# Patient Record
Sex: Male | Born: 1935 | Race: White | Hispanic: No | Smoking: Former smoker
Health system: Southern US, Community
[De-identification: ages and names within clinical notes are randomized; demographics above are authoritative.]

## PROBLEM LIST (undated history)

## (undated) DIAGNOSIS — E785 Hyperlipidemia, unspecified: Secondary | ICD-10-CM

## (undated) DIAGNOSIS — I519 Heart disease, unspecified: Secondary | ICD-10-CM

## (undated) DIAGNOSIS — I10 Essential (primary) hypertension: Secondary | ICD-10-CM

## (undated) DIAGNOSIS — E119 Type 2 diabetes mellitus without complications: Secondary | ICD-10-CM

## (undated) HISTORY — PX: CHOLECYSTECTOMY: SHX55

## (undated) HISTORY — DX: Essential (primary) hypertension: I10

## (undated) HISTORY — DX: Hyperlipidemia, unspecified: E78.5

## (undated) HISTORY — DX: Heart disease, unspecified: I51.9

## (undated) HISTORY — DX: Type 2 diabetes mellitus without complications: E11.9

## (undated) HISTORY — PX: RETROPUBIC PROSTATECTOMY: SUR1055

---

## 2004-12-16 ENCOUNTER — Encounter: Admission: RE | Admit: 2004-12-16 | Discharge: 2004-12-16 | Payer: Self-pay | Admitting: Neurological Surgery

## 2012-03-04 LAB — LIPID PANEL
Cholesterol: 112 mg/dL (ref 0–200)
LDL Cholesterol: 48 mg/dL

## 2012-03-29 LAB — CBC AND DIFFERENTIAL
Hemoglobin: 13.4 g/dL — AB (ref 13.5–17.5)
Platelets: 245 10*3/uL (ref 150–399)
WBC: 4.4 10^3/mL

## 2012-04-12 DIAGNOSIS — H903 Sensorineural hearing loss, bilateral: Secondary | ICD-10-CM | POA: Insufficient documentation

## 2012-04-12 DIAGNOSIS — I2510 Atherosclerotic heart disease of native coronary artery without angina pectoris: Secondary | ICD-10-CM | POA: Insufficient documentation

## 2012-06-15 LAB — HEMOGLOBIN A1C: Hgb A1c MFr Bld: 7.5 % — AB (ref 4.0–6.0)

## 2012-11-23 ENCOUNTER — Ambulatory Visit: Payer: Medicare Other | Admitting: Family Medicine

## 2012-11-23 ENCOUNTER — Encounter: Payer: Self-pay | Admitting: Family Medicine

## 2012-11-23 ENCOUNTER — Other Ambulatory Visit: Payer: Self-pay | Admitting: Family Medicine

## 2012-11-30 ENCOUNTER — Ambulatory Visit (INDEPENDENT_AMBULATORY_CARE_PROVIDER_SITE_OTHER): Payer: Medicare Other | Admitting: Family Medicine

## 2012-11-30 ENCOUNTER — Encounter: Payer: Self-pay | Admitting: Family Medicine

## 2012-11-30 VITALS — BP 158/94 | HR 103 | Wt 170.0 lb

## 2012-11-30 DIAGNOSIS — I10 Essential (primary) hypertension: Secondary | ICD-10-CM

## 2012-11-30 DIAGNOSIS — R059 Cough, unspecified: Secondary | ICD-10-CM

## 2012-11-30 DIAGNOSIS — Z79899 Other long term (current) drug therapy: Secondary | ICD-10-CM

## 2012-11-30 DIAGNOSIS — Z5181 Encounter for therapeutic drug level monitoring: Secondary | ICD-10-CM

## 2012-11-30 DIAGNOSIS — L989 Disorder of the skin and subcutaneous tissue, unspecified: Secondary | ICD-10-CM

## 2012-11-30 DIAGNOSIS — H903 Sensorineural hearing loss, bilateral: Secondary | ICD-10-CM

## 2012-11-30 DIAGNOSIS — I2510 Atherosclerotic heart disease of native coronary artery without angina pectoris: Secondary | ICD-10-CM

## 2012-11-30 DIAGNOSIS — E785 Hyperlipidemia, unspecified: Secondary | ICD-10-CM

## 2012-11-30 DIAGNOSIS — E119 Type 2 diabetes mellitus without complications: Secondary | ICD-10-CM

## 2012-11-30 DIAGNOSIS — R05 Cough: Secondary | ICD-10-CM

## 2012-11-30 MED ORDER — MOMETASONE FUROATE 50 MCG/ACT NA SUSP: NASAL | Status: DC

## 2012-11-30 MED ORDER — CLOTRIMAZOLE-BETAMETHASONE 1-0.05 % EX CREA: TOPICAL_CREAM | CUTANEOUS | Status: DC

## 2012-11-30 MED ORDER — RANOLAZINE ER 500 MG PO TB12: 500.0000 mg | ORAL_TABLET | Freq: Two times a day (BID) | ORAL | Status: DC

## 2012-11-30 MED ORDER — PIOGLITAZONE HCL 45 MG PO TABS: 45.0000 mg | ORAL_TABLET | Freq: Every day | ORAL | Status: DC

## 2012-11-30 NOTE — Progress Notes (Signed)
CC: Alex Zavala is a 77 y.o. male is here for Establish Care   Subjective: HPI:  Patient reports a history of coronary artery disease he is currently taking Plavix Ranexa and a beta blocker. He denies any recent exertional chest pain shortness of breath nor irregular heartbeat. Denies limb claudication. He takes a statin on a daily basis tries to stay active with walking.  Patient reports a history of type 2 diabetes currently taking Actos metformin Januvia and invokana. No outside blood sugars to report he believes it's been well over 3 months since his last A1c was checked. Denies hypoglycemic-like episodes. Denies motor or sensory disturbances, vision loss, poorly healing wounds, polyuria polyphasia or polydipsia  Patient reports a history of essential hypertension currently on a beta blocker, enalapril. No outside blood pressures to report. Denies headaches, dizziness, lightheadedness.  Patient has a lesion on his left upper back that has been present for 2 weeks mild sharp pain worse with touch pain is nonradiating. He was using clotrimazole/betamethasone which initially helped the pain however he ran out of this medication one week ago. He has no personal history of skin cancers he has had multiple lesions removed in the past. Pain is present all hours of the day nothing else makes better or worse other than above.  Complains of a cough that has been present For about a month is present on a daily basis mild-moderate severity nonproductive. He believes he was tried on a oral allergy medication that did not help. Symptoms are present all hours of the day. He denies wheezing, shortness of breath, orthopnea nor peripheral edema. Denies fevers, chills, nor chest pain  Review of Systems - General ROS: negative for - chills, fever, night sweats, weight gain or weight loss Ophthalmic ROS: negative for - decreased vision Psychological ROS: negative for - anxiety or depression ENT ROS: negative for  - hearing change, nasal congestion, tinnitus or allergies Hematological and Lymphatic ROS: negative for - bleeding problems, bruising or swollen lymph nodes Breast ROS: negative Respiratory ROS: no shortness of breath, or wheezing Cardiovascular ROS: no chest pain or dyspnea on exertion Gastrointestinal ROS: no abdominal pain, change in bowel habits, or black or bloody stools Genito-Urinary ROS: negative for - genital discharge, genital ulcers, incontinence or abnormal bleeding from genitals Musculoskeletal ROS: negative for - joint pain or muscle pain Neurological ROS: negative for - headaches or memory loss   Past Medical History  Diagnosis Date  . Hypertension   . Heart disease   . Diabetes   . Hyperlipidemia      Family History  Problem Relation Age of Onset  . Hyperlipidemia    . Hypertension       History  Substance Use Topics  . Smoking status: Former Games developer  . Smokeless tobacco: Not on file     Comment: quit smoking 50 years ago  . Alcohol Use: No     Objective: Filed Vitals:   11/30/12 1057  BP: 158/94  Pulse: 103    General: Alert and Oriented, No Acute Distress HEENT: Pupils equal, round, reactive to light. Conjunctivae clear.  External ears unremarkable, canals clear with intact TMs with appropriate landmarks.  Middle ear appears open without effusion. Pink inferior turbinates.  Moist mucous membranes, pharynx without inflammation nor lesions.  Neck supple without palpable lymphadenopathy nor abnormal masses. Lungs: Clear to auscultation bilaterally, no wheezing/ronchi/rales.  Comfortable work of breathing. Good air movement. Cardiac: Regular rate and rhythm. Normal S1/S2.  No murmurs, rubs, nor gallops.  Extremities: No peripheral edema.  Strong peripheral pulses.  Mental Status: No depression, anxiety, nor agitation. Skin: Warm and dry. Overlying the left scapula there is a half a centimeter pedunculated lesion with a bloody scab at the base which appears  moderately inflamed and is tender to the touch, lesion is fleshy colored  Assessment & Plan: Alex Zavala was seen today for establish care.  Diagnoses and associated orders for this visit:  Coronary atherosclerosis - ranolazine (RANEXA) 500 MG 12 hr tablet; Take 1 tablet (500 mg total) by mouth 2 (two) times daily.  Perceptive hearing loss, both sides  Type II diabetes mellitus - Hemoglobin A1c - COMPLETE METABOLIC PANEL WITH GFR - pioglitazone (ACTOS) 45 MG tablet; Take 1 tablet (45 mg total) by mouth daily.  Hyperlipidemia LDL goal < 70 - Lipid panel  Essential hypertension, benign - COMPLETE METABOLIC PANEL WITH GFR  Encounter for monitoring statin therapy - COMPLETE METABOLIC PANEL WITH GFR  Skin lesion of back - clotrimazole-betamethasone (LOTRISONE) cream; Apply to affected area twice a day for two weeks, may require four weeks if involving the feet/toes.  Cough - mometasone (NASONEX) 50 MCG/ACT nasal spray; Two sprays each nostril daily. To help prevent cough.    Coronary artery disease: Controlled continue beta blocker Plavix and Ranexa Type 2 diabetes: Clinically controlled however he is due for A1c and creatinine Hyperlipidemia: Clinically controlled however he is due for fasting LDL along with liver function as he's using a statin Essential hypertension: Uncontrolled chronic condition discussed diet and exercise interventions,  We will likely adjust beta blocker at next visit if remains elevated Skin lesion: Suspect this is an inflamed skin tag or separate keratosis restart betamethasone and if not improved in one week return for biopsy Cough: Suspect postnasal drip start Nasonex Return in about 4 weeks (around 12/28/2012).

## 2012-12-01 ENCOUNTER — Encounter: Payer: Self-pay | Admitting: *Deleted

## 2012-12-01 LAB — COMPLETE METABOLIC PANEL WITH GFR
ALT: 13 U/L (ref 0–53)
AST: 19 U/L (ref 0–37)
CO2: 27 mEq/L (ref 19–32)
Calcium: 10 mg/dL (ref 8.4–10.5)
GFR, Est Non African American: 79 mL/min
Glucose, Bld: 137 mg/dL — ABNORMAL HIGH (ref 70–99)
Total Bilirubin: 0.5 mg/dL (ref 0.3–1.2)

## 2012-12-01 LAB — HEMOGLOBIN A1C: Mean Plasma Glucose: 154 mg/dL — ABNORMAL HIGH (ref <117)

## 2012-12-01 LAB — LIPID PANEL: VLDL: 40 mg/dL (ref 0–40)

## 2012-12-05 ENCOUNTER — Encounter: Payer: Self-pay | Admitting: Family Medicine

## 2012-12-05 DIAGNOSIS — K219 Gastro-esophageal reflux disease without esophagitis: Secondary | ICD-10-CM | POA: Insufficient documentation

## 2012-12-05 DIAGNOSIS — R809 Proteinuria, unspecified: Secondary | ICD-10-CM | POA: Insufficient documentation

## 2012-12-05 DIAGNOSIS — Z8042 Family history of malignant neoplasm of prostate: Secondary | ICD-10-CM | POA: Insufficient documentation

## 2012-12-05 DIAGNOSIS — Z85038 Personal history of other malignant neoplasm of large intestine: Secondary | ICD-10-CM | POA: Insufficient documentation

## 2012-12-06 ENCOUNTER — Encounter: Payer: Self-pay | Admitting: *Deleted

## 2012-12-12 ENCOUNTER — Ambulatory Visit (INDEPENDENT_AMBULATORY_CARE_PROVIDER_SITE_OTHER): Payer: Medicare Other | Admitting: Family Medicine

## 2012-12-12 ENCOUNTER — Ambulatory Visit (INDEPENDENT_AMBULATORY_CARE_PROVIDER_SITE_OTHER): Payer: Medicare Other

## 2012-12-12 ENCOUNTER — Encounter: Payer: Self-pay | Admitting: Family Medicine

## 2012-12-12 VITALS — BP 199/113 | HR 87 | Temp 97.5°F | Wt 172.0 lb

## 2012-12-12 DIAGNOSIS — Z87891 Personal history of nicotine dependence: Secondary | ICD-10-CM

## 2012-12-12 DIAGNOSIS — R042 Hemoptysis: Secondary | ICD-10-CM

## 2012-12-12 DIAGNOSIS — I10 Essential (primary) hypertension: Secondary | ICD-10-CM

## 2012-12-12 NOTE — Progress Notes (Signed)
CC: Alex Zavala is a 77 y.o. male is here for Cough   Subjective: HPI:  Patient reports 2 days coughing up blood. This started yesterday morning bright red blood no clots has not been getting better or worse since onset. He's never had this before. He has had difficulty with a nonproductive cough without blood for the past month no improvement since starting nasal steroids. He takes Plavix daily. Has a history of essential hypertension did not take blood pressure medication this morning. Denies shortness of breath, chest pain, wheezing, orthopnea or bleeding elsewhere. Denies fevers, chills, nor fatigue. Nothing particularly makes bleeding better or worse. Denies sore throat, nasal congestion, postnasal drip.   Review Of Systems Outlined In HPI  Past Medical History  Diagnosis Date  . Hypertension   . Heart disease   . Diabetes   . Hyperlipidemia      Family History  Problem Relation Age of Onset  . Hyperlipidemia    . Hypertension       History  Substance Use Topics  . Smoking status: Former Games developer  . Smokeless tobacco: Not on file     Comment: quit smoking 50 years ago  . Alcohol Use: No     Objective: Filed Vitals:   12/12/12 1321  BP: 199/113  Pulse: 87  Temp: 97.5 F (36.4 C)    General: Alert and Oriented, No Acute Distress HEENT: Pupils equal, round, reactive to light. Conjunctivae clear.  External ears unremarkable, canals clear with intact TMs with appropriate landmarks.  Middle ear appears open without effusion. Pink inferior turbinates.  Moist mucous membranes, pharynx without inflammation nor lesions.  Neck supple without palpable lymphadenopathy nor abnormal masses. Lungs: Clear to auscultation bilaterally, no wheezing/ronchi/rales.  Comfortable work of breathing. Good air movement. Cardiac: Regular rate and rhythm. Normal S1/S2.  No murmurs, rubs, nor gallops.   Extremities: No peripheral edema.  Strong peripheral pulses.  Mental Status: No depression,  anxiety, nor agitation. Skin: Warm and dry.  Assessment & Plan: [place] was seen today for cough.  Diagnoses and associated orders for this visit:  Essential hypertension, benign  Hemoptysis - DG Chest 2 View; Future - Urinalysis, Routine w reflex microscopic - CBC w/Diff - Ambulatory referral to Pulmonology  History of smoking - Ambulatory referral to Pulmonology    Hemoptysis: Low suspicion of infectious etiology, fortunately chest x-ray is without evidence of mass or acute abnormality. Suspicion of possibly a small hemorrhage given his elevated blood pressure today and last visit. Will refer to pulmonology for consideration of bronchoscopy plus or minus CT scan. I encouraged him to stop his Plavix for now, his most recent stent was put in well over a year ago. Signs and symptoms requring emergent/urgent reevaluation were discussed with the patient. Essential hypertension: Uncontrolled, patient expresses understanding to take blood pressure medication immediately when he gets home followup later this week for blood pressure recheck  Return in about 1 week (around 12/19/2012).

## 2012-12-12 NOTE — Patient Instructions (Addendum)
-  Stop Plavix (clopidogrel) until told to restart. -You should be getting a phone call about a pulmonologist appointment sometime this week, call me if you do not get contacted about this by Wednesday.

## 2012-12-13 ENCOUNTER — Encounter: Payer: Self-pay | Admitting: Family Medicine

## 2012-12-13 LAB — URINALYSIS, MICROSCOPIC ONLY: Crystals: NONE SEEN

## 2012-12-13 LAB — CBC WITH DIFFERENTIAL/PLATELET
Basophils Absolute: 0 10*3/uL (ref 0.0–0.1)
Basophils Relative: 0 % (ref 0–1)
HCT: 40.8 % (ref 39.0–52.0)
Lymphs Abs: 1.6 10*3/uL (ref 0.7–4.0)
MCHC: 33.1 g/dL (ref 30.0–36.0)
MCV: 89.9 fL (ref 78.0–100.0)
Monocytes Absolute: 0.7 10*3/uL (ref 0.1–1.0)
Monocytes Relative: 14 % — ABNORMAL HIGH (ref 3–12)
Neutro Abs: 2.2 10*3/uL (ref 1.7–7.7)
Neutrophils Relative %: 48 % (ref 43–77)
Platelets: 267 10*3/uL (ref 150–400)
RBC: 4.54 MIL/uL (ref 4.22–5.81)
RDW: 15.2 % (ref 11.5–15.5)

## 2012-12-13 LAB — URINALYSIS, ROUTINE W REFLEX MICROSCOPIC
Hgb urine dipstick: NEGATIVE
Ketones, ur: NEGATIVE mg/dL
Nitrite: NEGATIVE
Specific Gravity, Urine: 1.024 (ref 1.005–1.030)
Urobilinogen, UA: 0.2 mg/dL (ref 0.0–1.0)

## 2012-12-14 ENCOUNTER — Encounter: Payer: Self-pay | Admitting: *Deleted

## 2012-12-15 ENCOUNTER — Encounter: Payer: Self-pay | Admitting: Family Medicine

## 2012-12-22 ENCOUNTER — Institutional Professional Consult (permissible substitution): Payer: Medicare Other | Admitting: Critical Care Medicine

## 2012-12-23 ENCOUNTER — Encounter: Payer: Self-pay | Admitting: Critical Care Medicine

## 2012-12-30 ENCOUNTER — Encounter: Payer: Self-pay | Admitting: Family Medicine

## 2012-12-30 ENCOUNTER — Ambulatory Visit (INDEPENDENT_AMBULATORY_CARE_PROVIDER_SITE_OTHER): Payer: Medicare Other | Admitting: Family Medicine

## 2012-12-30 VITALS — BP 157/82 | HR 75 | Wt 174.0 lb

## 2012-12-30 DIAGNOSIS — L918 Other hypertrophic disorders of the skin: Secondary | ICD-10-CM

## 2012-12-30 DIAGNOSIS — L909 Atrophic disorder of skin, unspecified: Secondary | ICD-10-CM

## 2012-12-30 DIAGNOSIS — R042 Hemoptysis: Secondary | ICD-10-CM

## 2012-12-30 DIAGNOSIS — Z23 Encounter for immunization: Secondary | ICD-10-CM

## 2012-12-30 NOTE — Patient Instructions (Signed)
Stop taking your plavix (clopidogrel) until after your CT scan of your chest.

## 2012-12-30 NOTE — Progress Notes (Signed)
CC: Alex Zavala is a 77 y.o. male is here for recheck place on the back   Subjective: HPI:  Followup skin lesion: Patient has been using clotrimazole on a lesion on his upper left back using this on a daily basis he reports he is no longer having pain or irritation anywhere in or on the back. Denies any new suspicious lesions or pigmented lesions.  Patient was unable to see pulmonology due [place]. He is still having occasional hemoptysis most days of the week. Symptoms do not seem to be getting better or worse. He is still taking Plavix. He denies shortness of breath, chest pain, fatigue, rapid heartbeat, nasal congestion fevers or chills.   Review Of Systems Outlined In HPI  Past Medical History  Diagnosis Date  . Hypertension   . Heart disease   . Diabetes   . Hyperlipidemia      Family History  Problem Relation Age of Onset  . Hyperlipidemia    . Hypertension       History  Substance Use Topics  . Smoking status: Former Games developer  . Smokeless tobacco: Not on file     Comment: quit smoking 50 years ago  . Alcohol Use: No     Objective: Filed Vitals:   12/30/12 0959  BP: 157/82  Pulse: 75    General: Alert and Oriented, No Acute Distress HEENT: Pupils equal, round, reactive to light. Conjunctivae clear.  Moist mucous membranes pharynx unremarkable Lungs: Clear to auscultation bilaterally, no wheezing/ronchi/rales.  Comfortable work of breathing. Good air movement. Cardiac: Regular rate and rhythm. Normal S1/S2.  No murmurs, rubs, nor gallops.   Extremities: No peripheral edema.  Strong peripheral pulses.  Mental Status: No depression, anxiety, nor agitation. Skin: Warm and dry. The skin lesion was on his back one month ago is no longer present there are no skin abnormalities on the back other than scattered seborrheic keratoses on the lower torso  Assessment & Plan: Alex Zavala was seen today for recheck place on the back.  Diagnoses and associated  orders for this visit:  Hemoptysis - CT Chest W Contrast; Future  Skin tag  Need for prophylactic vaccination and inoculation against influenza   Hemoptysis: Uncontrolled we are ordering CT scan of the chest with contrast I again instructed him to stop his Plavix until we have a source of his bleeding. Skin tag: I suspect that the lesion we saw last month was a inflamed skin tag, suspect the stalk thinned due to topical steroid and fell off by itself. No further intervention   Return in about 4 weeks (around 01/27/2013).

## 2013-01-05 ENCOUNTER — Ambulatory Visit (INDEPENDENT_AMBULATORY_CARE_PROVIDER_SITE_OTHER): Payer: Medicare Other

## 2013-01-05 ENCOUNTER — Encounter: Payer: Self-pay | Admitting: Family Medicine

## 2013-01-05 ENCOUNTER — Encounter: Payer: Self-pay | Admitting: *Deleted

## 2013-01-05 DIAGNOSIS — I517 Cardiomegaly: Secondary | ICD-10-CM

## 2013-01-05 DIAGNOSIS — E042 Nontoxic multinodular goiter: Secondary | ICD-10-CM

## 2013-01-05 DIAGNOSIS — R042 Hemoptysis: Secondary | ICD-10-CM

## 2013-01-05 DIAGNOSIS — I2510 Atherosclerotic heart disease of native coronary artery without angina pectoris: Secondary | ICD-10-CM

## 2013-01-05 DIAGNOSIS — R911 Solitary pulmonary nodule: Secondary | ICD-10-CM

## 2013-01-05 MED ORDER — IOHEXOL 300 MG/ML  SOLN
80.0000 mL | Freq: Once | INTRAMUSCULAR | Status: AC | PRN
  Administered 2013-01-05: 80 mL via INTRAVENOUS

## 2013-01-06 ENCOUNTER — Ambulatory Visit: Payer: Medicare Other | Admitting: Family Medicine

## 2013-01-30 ENCOUNTER — Ambulatory Visit: Payer: Medicare Other | Admitting: Family Medicine

## 2013-01-30 DIAGNOSIS — Z0289 Encounter for other administrative examinations: Secondary | ICD-10-CM

## 2013-03-29 ENCOUNTER — Telehealth: Payer: Self-pay | Admitting: *Deleted

## 2013-03-29 ENCOUNTER — Other Ambulatory Visit: Payer: Self-pay | Admitting: *Deleted

## 2013-03-29 DIAGNOSIS — L989 Disorder of the skin and subcutaneous tissue, unspecified: Secondary | ICD-10-CM

## 2013-03-29 MED ORDER — SITAGLIPTIN PHOSPHATE 100 MG PO TABS: 100.0000 mg | ORAL_TABLET | Freq: Every day | ORAL | Status: DC

## 2013-03-29 MED ORDER — CELECOXIB 200 MG PO CAPS: 200.0000 mg | ORAL_CAPSULE | Freq: Every day | ORAL | Status: DC | PRN

## 2013-03-29 MED ORDER — LOVASTATIN 40 MG PO TABS: ORAL_TABLET | ORAL | Status: DC

## 2013-03-29 MED ORDER — FENOFIBRATE 48 MG PO TABS: 48.0000 mg | ORAL_TABLET | Freq: Every day | ORAL | Status: DC

## 2013-03-29 MED ORDER — CLOTRIMAZOLE-BETAMETHASONE 1-0.05 % EX CREA: TOPICAL_CREAM | CUTANEOUS | Status: AC

## 2013-03-29 MED ORDER — CLOPIDOGREL BISULFATE 75 MG PO TABS: 75.0000 mg | ORAL_TABLET | Freq: Once | ORAL | Status: DC

## 2013-03-29 NOTE — Telephone Encounter (Signed)
Pt came in because he needed refills of his medications to be sent to [facility] in [place]. Also his new address is [address] [id number].[name] [place]

## 2013-03-29 NOTE — Telephone Encounter (Signed)
Pt would like to inform dr.hommel that due to changes in his insurance he can no longer come here .[name] [place]

## 2013-04-03 ENCOUNTER — Other Ambulatory Visit: Payer: Self-pay | Admitting: *Deleted

## 2013-04-03 MED ORDER — CELECOXIB 200 MG PO CAPS: 200.0000 mg | ORAL_CAPSULE | Freq: Every day | ORAL | Status: DC | PRN

## 2013-04-03 MED ORDER — FENOFIBRATE 48 MG PO TABS: 48.0000 mg | ORAL_TABLET | Freq: Every day | ORAL | Status: DC

## 2013-04-03 MED ORDER — METOPROLOL TARTRATE 100 MG PO TABS: 100.0000 mg | ORAL_TABLET | Freq: Every day | ORAL | Status: DC

## 2013-05-12 ENCOUNTER — Other Ambulatory Visit: Payer: Self-pay | Admitting: Family Medicine

## 2013-05-19 ENCOUNTER — Other Ambulatory Visit: Payer: Self-pay | Admitting: Family Medicine

## 2013-06-30 ENCOUNTER — Other Ambulatory Visit: Payer: Self-pay | Admitting: Family Medicine

## 2013-07-05 ENCOUNTER — Encounter: Payer: Self-pay | Admitting: Family Medicine

## 2013-07-05 ENCOUNTER — Telehealth: Payer: Self-pay | Admitting: Family Medicine

## 2013-07-05 ENCOUNTER — Ambulatory Visit (INDEPENDENT_AMBULATORY_CARE_PROVIDER_SITE_OTHER): Payer: Medicare Other | Admitting: Family Medicine

## 2013-07-05 VITALS — BP 161/86 | HR 93 | Ht 64.0 in | Wt 167.0 lb

## 2013-07-05 DIAGNOSIS — E119 Type 2 diabetes mellitus without complications: Secondary | ICD-10-CM

## 2013-07-05 DIAGNOSIS — Z13220 Encounter for screening for lipoid disorders: Secondary | ICD-10-CM

## 2013-07-05 DIAGNOSIS — Z125 Encounter for screening for malignant neoplasm of prostate: Secondary | ICD-10-CM

## 2013-07-05 DIAGNOSIS — Z131 Encounter for screening for diabetes mellitus: Secondary | ICD-10-CM

## 2013-07-05 DIAGNOSIS — Z136 Encounter for screening for cardiovascular disorders: Secondary | ICD-10-CM

## 2013-07-05 DIAGNOSIS — Z0000 Encounter for general adult medical examination without abnormal findings: Secondary | ICD-10-CM

## 2013-07-05 MED ORDER — ENALAPRIL MALEATE 10 MG PO TABS: 10.0000 mg | ORAL_TABLET | Freq: Every day | ORAL | Status: DC

## 2013-07-05 MED ORDER — CELECOXIB 200 MG PO CAPS: 200.0000 mg | ORAL_CAPSULE | Freq: Every day | ORAL | Status: DC | PRN

## 2013-07-05 MED ORDER — CANAGLIFLOZIN 100 MG PO TABS
1.0000 | ORAL_TABLET | Freq: Every day | ORAL | Status: DC
Start: 2013-07-05 — End: 2013-10-03

## 2013-07-05 MED ORDER — METOPROLOL TARTRATE 100 MG PO TABS: ORAL_TABLET | ORAL | Status: DC

## 2013-07-05 MED ORDER — CLOTRIMAZOLE-BETAMETHASONE 1-0.05 % EX CREA: TOPICAL_CREAM | CUTANEOUS | Status: DC

## 2013-07-05 NOTE — Telephone Encounter (Signed)
Seth Bake, Can you please see if the [facility] has any records of colonoscopies within the last 5 year for this patient. He believes he had this done in [place] most recently possibly 2 years ago

## 2013-07-05 NOTE — Patient Instructions (Signed)
Dr. Atul Delucia's General Advice Following Your Complete Physical Exam  The Benefits of Regular Exercise: Unless you suffer from an uncontrolled cardiovascular condition, studies strongly suggest that regular exercise and physical activity will add to both the quality and length of your life.  The World Health Organization recommends 150 minutes of moderate intensity aerobic activity every week.  This is best split over 3-4 days a week, and can be as simple as a brisk walk for just over 35 minutes "most days of the week".  This type of exercise has been shown to lower LDL-Cholesterol, lower average blood sugars, lower blood pressure, lower cardiovascular disease risk, improve memory, and increase one's overall sense of wellbeing.  The addition of anaerobic (or "strength training") exercises offers additional benefits including but not limited to increased metabolism, prevention of osteoporosis, and improved overall cholesterol levels.  How Can I Strive For A Low-Fat Diet?: Current guidelines recommend that 25-35 percent of your daily energy (food) intake should come from fats.  One might ask how can this be achieved without having to dissect each meal on a daily basis?  Switch to skim or 1% milk instead of whole milk.  Focus on lean meats such as ground turkey, fresh fish, baked chicken, and lean cuts of beef as your source of dietary protein.  Limit saturated fat consumption to less than 10% of your daily caloric intake.  Limit trans fatty acid consumption primarily by limiting synthetic trans fats such as partially hydrogenated oils (Ex: fried fast foods).  Substitute olive or vegetable oil for solid fats where possible.  Moderation of Salt Intake: Provided you don't carry a diagnosis of congestive heart failure nor renal failure, I recommend a daily allowance of no more than 2300 mg of salt (sodium).  Keeping under this daily goal is associated with a decreased risk of cardiovascular events, creeping  above it can lead to elevated blood pressures and increases your risk of cardiovascular events.  Milligrams (mg) of salt is listed on all nutrition labels, and your daily intake can add up faster than you think.  Most canned and frozen dinners can pack in over half your daily salt allowance in one meal.    Lifestyle Health Risks: Certain lifestyle choices carry specific health risks.  As you may already know, tobacco use has been associated with increasing one's risk of cardiovascular disease, pulmonary disease, numerous cancers, among many other issues.  What you may not know is that there are medications and nicotine replacement strategies that can more than double your chances of successfully quitting.  I would be thrilled to help manage your quitting strategy if you currently use tobacco products.  When it comes to alcohol use, I've yet to find an "ideal" daily allowance.  Provided an individual does not have a medical condition that is exacerbated by alcohol consumption, general guidelines determine "safe drinking" as no more than two standard drinks for a man or no more than one standard drink for a male per day.  However, much debate still exists on whether any amount of alcohol consumption is technically "safe".  My general advice, keep alcohol consumption to a minimum for general health promotion.  If you or others believe that alcohol, tobacco, or recreational drug use is interfering with your life, I would be happy to provide confidential counseling regarding treatment options.  General "Over The Counter" Nutrition Advice: Postmenopausal women should aim for a daily calcium intake of 1200 mg, however a significant portion of this might already be   provided by diets including milk, yogurt, cheese, and other dairy products.  Vitamin D has been shown to help preserve bone density, prevent fatigue, and has even been shown to help reduce falls in the elderly.  Ensuring a daily intake of 800 Units of  Vitamin D is a good place to start to enjoy the above benefits, we can easily check your Vitamin D level to see if you'd potentially benefit from supplementation beyond 800 Units a day.  Folic Acid intake should be of particular concern to women of childbearing age.  Daily consumption of 400-800 mcg of Folic Acid is recommended to minimize the chance of spinal cord defects in a fetus should pregnancy occur.    For many adults, accidents still remain one of the most common culprits when it comes to cause of death.  Some of the simplest but most effective preventitive habits you can adopt include regular seatbelt use, proper helmet use, securing firearms, and regularly testing your smoke and carbon monoxide detectors.  Krisha Beegle B. Hager [place] DO [facility] [address], [address] [place], [place] [address] Phone: [phone]  

## 2013-07-05 NOTE — Progress Notes (Signed)
Subjective:    Alex Zavala is a 78 y.o. male who presents for Medicare Annual/Subsequent preventive examination.   Preventive Screening-Counseling & Management  Colonoscopy: Most recent colonoscopy 09/2009 based on our records, he predicts this was in 2014 with [facility]. Will ask my staff to look into obtaining these records  Prostate: Discussed screening risks/beneifts with patient today, history of prostatectomy. He tells me that his understanding was that the prostate cancer was confined to the prostate and had not metastasized. He is interested in PSA  Influenza Vaccine: Out of season Pneumovax: 2006 and 2011 Td/Tdap: 2008 Zoster: 2014  He's requesting refills on multiple medications for his pre-existing conditions. We will provide him with refills until he can followup for a visit dedicated to these conditions.  Since I saw him last he tells that he had a fall on his buttocks when slipping in his house. This is been the only fall in the last year.   Tobacco History  Smoking status  . Former Smoker  Smokeless tobacco  . Not on file    Comment: quit smoking 50 years ago    Problems Prior to Visit 1. essential hypertension, Type 2 diabetes, CAD  Current Problems (verified) Patient Active Problem List   Diagnosis Date Noted  . Hemoptysis 12/12/2012  . GERD (gastroesophageal reflux disease) 12/05/2012  . Microalbuminuria 12/05/2012  . Family history of colon cancer 12/05/2012  . Family history of prostate cancer 12/05/2012  . Hyperlipidemia LDL goal < 70 11/30/2012  . Essential hypertension, benign 11/30/2012  . Skin lesion of back 11/30/2012  . Cough 11/30/2012  . Type II diabetes mellitus 04/12/2012  . Coronary atherosclerosis 04/12/2012  . Perceptive hearing loss, both sides 04/12/2012    Medications Prior to Visit Current Outpatient Prescriptions on File Prior to Visit  Medication Sig Dispense Refill  . celecoxib (CELEBREX) 200 MG capsule Take 1 capsule  (200 mg total) by mouth daily as needed.  30 capsule  1  . clopidogrel (PLAVIX) 75 MG tablet Take 1 tablet (75 mg total) by mouth once.  30 tablet  2  . enalapril (VASOTEC) 10 MG tablet       . fenofibrate (TRICOR) 48 MG tablet Take 1 tablet (48 mg total) by mouth daily.  30 tablet  2  . INVOKANA 100 MG TABS       . lovastatin (MEVACOR) 40 MG tablet Take 2 tablets by mouth daily.  60 tablet  2  . metFORMIN (GLUCOPHAGE) 1000 MG tablet       . metoprolol (LOPRESSOR) 100 MG tablet TAKE 1 TABLET (100 MG TOTAL) BY MOUTH DAILY.  30 tablet  0  . mometasone (NASONEX) 50 MCG/ACT nasal spray Two sprays each nostril daily. To help prevent cough.  17 g  2  . pioglitazone (ACTOS) 45 MG tablet Take 1 tablet (45 mg total) by mouth daily.  90 tablet  1  . ranolazine (RANEXA) 500 MG 12 hr tablet Take 1 tablet (500 mg total) by mouth 2 (two) times daily.  180 tablet  1  . sitaGLIPtin (JANUVIA) 100 MG tablet Take 1 tablet (100 mg total) by mouth daily.  30 tablet  2  . sitaGLIPtin (JANUVIA) 100 MG tablet Take 1 tablet (100 mg total) by mouth daily.  30 tablet  2   No current facility-administered medications on file prior to visit.    Current Medications (verified) Current Outpatient Prescriptions  Medication Sig Dispense Refill  . celecoxib (CELEBREX) 200 MG capsule Take 1 capsule (200  mg total) by mouth daily as needed.  30 capsule  1  . clopidogrel (PLAVIX) 75 MG tablet Take 1 tablet (75 mg total) by mouth once.  30 tablet  2  . enalapril (VASOTEC) 10 MG tablet       . fenofibrate (TRICOR) 48 MG tablet Take 1 tablet (48 mg total) by mouth daily.  30 tablet  2  . INVOKANA 100 MG TABS       . lovastatin (MEVACOR) 40 MG tablet Take 2 tablets by mouth daily.  60 tablet  2  . metFORMIN (GLUCOPHAGE) 1000 MG tablet       . metoprolol (LOPRESSOR) 100 MG tablet TAKE 1 TABLET (100 MG TOTAL) BY MOUTH DAILY.  30 tablet  0  . mometasone (NASONEX) 50 MCG/ACT nasal spray Two sprays each nostril daily. To help prevent  cough.  17 g  2  . pioglitazone (ACTOS) 45 MG tablet Take 1 tablet (45 mg total) by mouth daily.  90 tablet  1  . ranolazine (RANEXA) 500 MG 12 hr tablet Take 1 tablet (500 mg total) by mouth 2 (two) times daily.  180 tablet  1  . sitaGLIPtin (JANUVIA) 100 MG tablet Take 1 tablet (100 mg total) by mouth daily.  30 tablet  2  . sitaGLIPtin (JANUVIA) 100 MG tablet Take 1 tablet (100 mg total) by mouth daily.  30 tablet  2   No current facility-administered medications for this visit.     Allergies (verified) Review of patient's allergies indicates no known allergies.   PAST HISTORY  Family History Family History  Problem Relation Age of Onset  . Hyperlipidemia    . Hypertension      Social History History  Substance Use Topics  . Smoking status: Former Research scientist (life sciences)  . Smokeless tobacco: Not on file     Comment: quit smoking 50 years ago  . Alcohol Use: No    Are there smokers in your home (other than you)?  No  Risk Factors Current exercise habits: The patient does not participate in regular exercise at present.  Dietary issues discussed: DASH diet, carbs in moderation   Cardiac risk factors: advanced age (older than 74 for men, 8 for women), diabetes mellitus, dyslipidemia and sedentary lifestyle.  Depression Screen (Note: if answer to either of the following is "Yes", a more complete depression screening is indicated)   Q1: Over the past two weeks, have you felt down, depressed or hopeless? No  Q2: Over the past two weeks, have you felt little interest or pleasure in doing things? No  Have you lost interest or pleasure in daily life? No  Do you often feel hopeless? No  Do you cry easily over simple problems? No  Activities of Daily Living In your present state of health, do you have any difficulty performing the following activities?:  Driving? No Managing money?  No Feeding yourself? No Getting from bed to chair? No Climbing a flight of stairs? No Preparing food and  eating?: No Bathing or showering? No Getting dressed: No Getting to the toilet? No Using the toilet:No Moving around from place to place: No In the past year have you fallen or had a near fall?:Yes   Are you sexually active?  No  Do you have more than one partner?  No  Hearing Difficulties: Yes Do you often ask people to speak up or repeat themselves? Yes Do you experience ringing or noises in your ears? No Do you have difficulty understanding soft or  whispered voices? Yes   Do you feel that you have a problem with memory? No  Do you often misplace items? No  Do you feel safe at home?  Yes  Cognitive Testing  Alert? Yes  Normal Appearance?Yes  Oriented to person? Yes  Place? Yes   Time? Yes  Recall of three objects?  Yes  Can perform simple calculations? Yes  Displays appropriate judgment?Yes  Can read the correct time from a watch face?Yes   Advanced Directives have been discussed with the patient? Yes   List the Names of Other Physician/Practitioners you currently use: 1.    Indicate any recent Medical Services you may have received from other than [facility] providers in the past year (date may be approximate).  Immunization History  Administered Date(s) Administered  . Hepatitis B 01/28/2011, 07/03/2011, 03/01/2012  . Influenza Split 01/28/2011, 03/01/2012  . Influenza,inj,Quad PF,36+ Mos 12/30/2012  . Pneumococcal Conjugate-13 06/17/2004, 12/05/2009  . Tdap 05/11/2006  . Zoster 06/01/2012    Screening Tests Health Maintenance  Topic Date Due  . Colonoscopy  04/21/1987  . Pneumococcal Polysaccharide Vaccine Age 37 And Over  04/20/2002  . Influenza Vaccine  10/28/2013  . Tetanus/tdap  05/11/2016  . Zostavax  Completed    All answers were reviewed with the patient and necessary referrals were made:  Marcial Pacas, DO   07/05/2013   History reviewed: allergies, current medications, past family history, past medical history, past social history, past surgical  history and problem list  Review of Systems Review of Systems - General ROS: negative for - chills, fever, night sweats, weight gain or weight loss Ophthalmic ROS: negative for - decreased vision Psychological ROS: negative for - anxiety or depression ENT ROS: negative for - hearing change, nasal congestion, tinnitus or allergies Hematological and Lymphatic ROS: negative for - bleeding problems, bruising or swollen lymph nodes Breast ROS: negative Respiratory ROS: no cough, shortness of breath, or wheezing Cardiovascular ROS: no chest pain or dyspnea on exertion Gastrointestinal ROS: no abdominal pain, change in bowel habits, or black or bloody stools Genito-Urinary ROS: negative for - genital discharge, genital ulcers, incontinence or abnormal bleeding from genitals Musculoskeletal ROS: negative for - joint pain or muscle pain Neurological ROS: negative for - headaches or memory loss Dermatological ROS: negative for lumps, mole changes, rash and skin lesion changes   Objective:     Vision by Snellen chart: right eye:20/40, left eye:20/20 There were no vitals taken for this visit. There is no height or weight on file to calculate BMI.  General: No Acute Distress HEENT: Atraumatic, normocephalic, conjunctivae normal without scleral icterus.  No nasal discharge, hearing grossly intact, TMs with good landmarks bilaterally with no middle ear abnormalities, posterior pharynx clear without oral lesions. Neck: Supple, trachea midline, no cervical nor supraclavicular adenopathy. Pulmonary: Clear to auscultation bilaterally without wheezing, rhonchi, nor rales. Cardiac: Regular rate and rhythm.  No murmurs, rubs, nor gallops. No peripheral edema.  2+ peripheral pulses bilaterally. Abdomen: Bowel sounds normal.  Pain was easily reducible ventral hernia.  Non-tender without rebound.  Negative Murphy's sign. GU: Bilateral descended testes without inguinal hernia MSK: Grossly intact, no signs of  weakness.  Full strength throughout upper and lower extremities.  Full ROM in upper and lower extremities.  No midline spinal tenderness. Neuro: Gait unremarkable, CN II-XII grossly intact.  C5-C6 Reflex 2/4 Bilaterally, L4 Reflex 2/4 Bilaterally.  Cerebellar function intact. Skin: No rashes. Psych: Alert and oriented to person/place/time.  Thought process normal. No anxiety/depression.  Assessment:     Falls with poor vision, history of abnormal colonoscopy and prostate cancer     Plan:     During the course of the visit the patient was educated and counseled about appropriate screening and preventive services including:    Prostate cancer screening  Colorectal cancer screening  Advanced directives: has NO advanced directive  - add't info requested. Referral to SW: not applicable he was given a handout on how to obtain and Chief Strategy Officer advanced directives online  Diet review for nutrition referral? Not indicated   Declines physical therapy referral for balance conditioning to help prevent falls. To further help prevent falls I strongly encouraged him to followup with optometry for consideration of glasses    Patient Instructions (the written plan) was given to the patient.  Medicare Attestation I have personally reviewed: The patient's medical and social history Their use of alcohol, tobacco or illicit drugs Their current medications and supplements The patient's functional ability including ADLs,fall risks, home safety risks, cognitive, and hearing and visual impairment Diet and physical activities Evidence for depression or mood disorders  The patient's weight, height, BMI, and visual acuity have been recorded in the chart.  I have made referrals, counseling, and provided education to the patient based on review of the above and I have provided the patient with a written personalized care plan for preventive services.    I have strongly encouraged him to return within the  next three months for routine follow up of pre-existing conditions.  Refills provided to last until that time.   Marcial Pacas, DO   07/05/2013

## 2013-07-06 LAB — LIPID PANEL
Cholesterol: 114 mg/dL (ref 0–200)
HDL: 51 mg/dL (ref >39)
LDL Cholesterol: 51 mg/dL (ref 0–99)
TRIGLYCERIDES: 61 mg/dL (ref <150)
Total CHOL/HDL Ratio: 2.2 Ratio
VLDL: 12 mg/dL (ref 0–40)

## 2013-07-06 LAB — BASIC METABOLIC PANEL WITH GFR
BUN: 27 mg/dL — ABNORMAL HIGH (ref 6–23)
CALCIUM: 9.6 mg/dL (ref 8.4–10.5)
CO2: 27 meq/L (ref 19–32)
Chloride: 103 mEq/L (ref 96–112)
Creat: 1.17 mg/dL (ref 0.50–1.35)
GFR, Est African American: 70 mL/min
GFR, Est Non African American: 60 mL/min
Glucose, Bld: 108 mg/dL — ABNORMAL HIGH (ref 70–99)
Potassium: 4.7 mEq/L (ref 3.5–5.3)
Sodium: 136 mEq/L (ref 135–145)

## 2013-07-06 LAB — HEMOGLOBIN A1C
Hgb A1c MFr Bld: 7 % — ABNORMAL HIGH (ref <5.7)
MEAN PLASMA GLUCOSE: 154 mg/dL — AB (ref <117)

## 2013-07-07 LAB — PSA: PSA: <0.01 ng/mL (ref <=4.00)

## 2013-07-07 NOTE — Telephone Encounter (Signed)
They wont release his records without a signed release. His last one was in 2011

## 2013-07-10 NOTE — Telephone Encounter (Signed)
Called and a man answered the phone who didn't identify himself; pt was not there at the time but message was left with man for pt to come by office to sign release form so that we can fax it to [facility]

## 2013-07-10 NOTE — Telephone Encounter (Signed)
Will you please ask patient if he can request this to be released to [place]?

## 2013-07-11 ENCOUNTER — Encounter: Payer: Self-pay | Admitting: Family Medicine

## 2013-07-27 ENCOUNTER — Other Ambulatory Visit: Payer: Self-pay | Admitting: Family Medicine

## 2013-07-27 NOTE — Telephone Encounter (Signed)
Is this patient on Tricor and lovastatin? Clemetine Marker, LPN

## 2013-07-27 NOTE — Telephone Encounter (Signed)
yes

## 2013-08-22 ENCOUNTER — Encounter: Payer: Self-pay | Admitting: Family Medicine

## 2013-08-22 DIAGNOSIS — R3129 Other microscopic hematuria: Secondary | ICD-10-CM | POA: Insufficient documentation

## 2013-08-29 ENCOUNTER — Other Ambulatory Visit: Payer: Self-pay | Admitting: Family Medicine

## 2013-10-03 ENCOUNTER — Encounter: Payer: Self-pay | Admitting: Family Medicine

## 2013-10-03 ENCOUNTER — Ambulatory Visit (INDEPENDENT_AMBULATORY_CARE_PROVIDER_SITE_OTHER): Payer: Medicare Other

## 2013-10-03 ENCOUNTER — Ambulatory Visit: Payer: Medicare Other | Admitting: Family Medicine

## 2013-10-03 ENCOUNTER — Ambulatory Visit (INDEPENDENT_AMBULATORY_CARE_PROVIDER_SITE_OTHER): Payer: Medicare Other | Admitting: Family Medicine

## 2013-10-03 VITALS — BP 153/80 | HR 63 | Wt 163.0 lb

## 2013-10-03 DIAGNOSIS — K2970 Gastritis, unspecified, without bleeding: Secondary | ICD-10-CM

## 2013-10-03 DIAGNOSIS — E1129 Type 2 diabetes mellitus with other diabetic kidney complication: Secondary | ICD-10-CM

## 2013-10-03 DIAGNOSIS — M25552 Pain in left hip: Secondary | ICD-10-CM

## 2013-10-03 DIAGNOSIS — R059 Cough, unspecified: Secondary | ICD-10-CM

## 2013-10-03 DIAGNOSIS — I10 Essential (primary) hypertension: Secondary | ICD-10-CM

## 2013-10-03 DIAGNOSIS — E1121 Type 2 diabetes mellitus with diabetic nephropathy: Secondary | ICD-10-CM

## 2013-10-03 DIAGNOSIS — M169 Osteoarthritis of hip, unspecified: Secondary | ICD-10-CM

## 2013-10-03 DIAGNOSIS — M1610 Unilateral primary osteoarthritis, unspecified hip: Secondary | ICD-10-CM

## 2013-10-03 DIAGNOSIS — R05 Cough: Secondary | ICD-10-CM

## 2013-10-03 DIAGNOSIS — N058 Unspecified nephritic syndrome with other morphologic changes: Secondary | ICD-10-CM

## 2013-10-03 DIAGNOSIS — M25559 Pain in unspecified hip: Secondary | ICD-10-CM

## 2013-10-03 DIAGNOSIS — K2990 Gastroduodenitis, unspecified, without bleeding: Secondary | ICD-10-CM

## 2013-10-03 MED ORDER — METOPROLOL TARTRATE 100 MG PO TABS: ORAL_TABLET | ORAL | Status: DC

## 2013-10-03 MED ORDER — PIOGLITAZONE HCL 45 MG PO TABS: ORAL_TABLET | ORAL | Status: DC

## 2013-10-03 MED ORDER — SITAGLIPTIN PHOSPHATE 100 MG PO TABS: ORAL_TABLET | ORAL | Status: DC

## 2013-10-03 MED ORDER — PANTOPRAZOLE SODIUM 40 MG PO TBEC: 40.0000 mg | DELAYED_RELEASE_TABLET | Freq: Every day | ORAL | Status: DC

## 2013-10-03 MED ORDER — MOMETASONE FUROATE 50 MCG/ACT NA SUSP: NASAL | Status: DC

## 2013-10-03 MED ORDER — LOVASTATIN 40 MG PO TABS: ORAL_TABLET | ORAL | Status: DC

## 2013-10-03 MED ORDER — ENALAPRIL MALEATE 20 MG PO TABS: 20.0000 mg | ORAL_TABLET | Freq: Every day | ORAL | Status: DC

## 2013-10-03 MED ORDER — CANAGLIFLOZIN 100 MG PO TABS: 1.0000 | ORAL_TABLET | Freq: Every day | ORAL | Status: DC

## 2013-10-03 MED ORDER — CLOTRIMAZOLE-BETAMETHASONE 1-0.05 % EX CREA: TOPICAL_CREAM | CUTANEOUS | Status: DC

## 2013-10-03 NOTE — Progress Notes (Signed)
CC: Alex Zavala is a 78 y.o. male is here for Diabetes   Subjective: HPI:  Followup essential hypertension: Continues to take enalapril on a daily basis without outside blood pressures to report. Denies chest pain, shortness of breath no nor peripheral edema. Denies motor or sensory disturbances other than that described below.  Followup type 2 diabetes: No outside blood sugars to report. Denies polyuria polyphasia or polydipsia. Continues on Januvia, Actos, metformin,invokana without intolerance or side effects.  Complains of a nonproductive cough that has returned within a few days after he ran out of Nasonex. His cough is completely alleviated after starting the Nasonex months ago. He has a history of blood in his sputum however has not had this since I saw him initially with that complaint. Denies wheezing, shortness of breath, chest pain, nor chest tightness. Review of systems is positive for subjective postnasal drip.  Complains of left upper quadrant abdominal pain that radiates slightly into the epigastric region. Has been present on daily basis for the last 2-3 weeks. It occurs after eating anything. Nothing particularly makes it better. It is mild in severity described as a sharp sensation. It has not been accompanied by any nausea, vomiting, decreased appetite, constipation, diarrhea  Complains of left hip pain that has been present for years. It is described only as pain, moderate in severity, worse with walking. Slightly improved taking Celebrex but nothing else seems to make it better or worse.  States the pain is persistent and character/severity has not been changing over the past few years. He wants another is anything he can do about it other than Celebrex. Pain is localized to the  Anterior lateral aspect of the hip   Review Of Systems Outlined In HPI  Past Medical History  Diagnosis Date  . Hypertension   . Heart disease   . Diabetes   . Hyperlipidemia     Past  Surgical History  Procedure Laterality Date  . Cholecystectomy    . Retropubic prostatectomy     Family History  Problem Relation Age of Onset  . Hyperlipidemia    . Hypertension      History   Social History  . Marital Status: Divorced    Spouse Name: N/A    Number of Children: N/A  . Years of Education: N/A   Occupational History  . Not on file.   Social History Main Topics  . Smoking status: Former Research scientist (life sciences)  . Smokeless tobacco: Not on file     Comment: quit smoking 50 years ago  . Alcohol Use: No  . Drug Use: No  . Sexual Activity: No   Other Topics Concern  . Not on file   Social History Narrative  . No narrative on file     Objective: BP 153/80  Pulse 63  Wt 163 lb (73.936 kg)  General: Alert and Oriented, No Acute Distress HEENT: Pupils equal, round, reactive to light. Conjunctivae clear.  Moist mucous membranes pharynx unremarkable Lungs: Clear to auscultation bilaterally, no wheezing/ronchi/rales.  Comfortable work of breathing. Good air movement. Cardiac: Regular rate and rhythm. Normal S1/S2.  No murmurs, rubs, nor gallops.   Abdomen: Normal bowel sounds, soft and non tender without palpable masses. Extremities: No peripheral edema.  Strong peripheral pulses. Full range of motion and strength of the left hip, activation of the adductors/abductors/hip flexor/hip extensors do not produce any reproduction of his pain. Mental Status: No depression, anxiety, nor agitation. Skin: Warm and dry.  Assessment & Plan: Alex Zavala was  seen today for diabetes.  Diagnoses and associated orders for this visit:  Essential hypertension, benign - metoprolol (LOPRESSOR) 100 MG tablet; TAKE 1 TABLET (100 MG TOTAL) BY MOUTH DAILY. - enalapril (VASOTEC) 20 MG tablet; Take 1 tablet (20 mg total) by mouth daily.  Type 2 diabetes mellitus with diabetic nephropathy - pioglitazone (ACTOS) 45 MG tablet; TAKE 1 TABLET EVERY DAY - Canagliflozin (INVOKANA) 100 MG TABS; Take 1 tablet  (100 mg total) by mouth daily. - sitaGLIPtin (JANUVIA) 100 MG tablet; TAKE 1 TABLET EVERY DAY - lovastatin (MEVACOR) 40 MG tablet; TAKE 2 TABLETS EVERY DAY  Cough - mometasone (NASONEX) 50 MCG/ACT nasal spray; Two sprays each nostril daily. To help prevent cough.  Gastritis - pantoprazole (PROTONIX) 40 MG tablet; Take 1 tablet (40 mg total) by mouth daily.  Left hip pain - DG Hip Complete Left; Future  Other Orders - clotrimazole-betamethasone (LOTRISONE) cream; APPLY TO AFFECTED AREA TWICE A DAY FOR TWO WEEKS, MAY REQUIRE FOUR WEEKS IF INVOLVING THE FEET/TOES.    Essential hypertension: Controlled increasing enalapril Type 2 diabetes: A1c of 7.2, controlled today no change in medications Cough: Restart Nasonex Gastritis: Start Protonix Left hip pain: Obtaining films to help direct further management. Sports medicine/orthopedics versus physical therapy  40 minutes spent face-to-face during visit today of which at least 50% was counseling or coordinating care regarding: 1. Essential hypertension, benign   2. Type 2 diabetes mellitus with diabetic nephropathy   3. Cough   4. Gastritis   5. Left hip pain      Return in about 3 months (around 01/03/2014).

## 2013-10-04 ENCOUNTER — Telehealth: Payer: Self-pay | Admitting: Family Medicine

## 2013-10-04 ENCOUNTER — Ambulatory Visit: Payer: Medicare Other | Admitting: Family Medicine

## 2013-10-04 ENCOUNTER — Encounter: Payer: Self-pay | Admitting: *Deleted

## 2013-10-04 DIAGNOSIS — M1612 Unilateral primary osteoarthritis, left hip: Secondary | ICD-10-CM

## 2013-10-04 NOTE — Telephone Encounter (Signed)
Alex Zavala, Will you please let patient know that his Xray confirmed mild to moderate osteoarthritis in the hip.  I've placed a referral for Physical Therapy to help reduce his pain.

## 2013-10-04 NOTE — Telephone Encounter (Signed)
Letter mailed

## 2013-10-11 ENCOUNTER — Other Ambulatory Visit: Payer: Self-pay | Admitting: Family Medicine

## 2013-10-25 ENCOUNTER — Other Ambulatory Visit: Payer: Self-pay | Admitting: Family Medicine

## 2013-11-02 ENCOUNTER — Other Ambulatory Visit: Payer: Self-pay | Admitting: Family Medicine

## 2013-12-07 ENCOUNTER — Telehealth: Payer: Self-pay | Admitting: Family Medicine

## 2013-12-07 NOTE — Telephone Encounter (Signed)
Seth Bake, Will you please let patient know that his Nasonex is apparently on backorder and not available at his [facility].  Until this is available I would recommend using OTC Nasonex or OTC flonase whichever one is cheaper.

## 2013-12-11 NOTE — Telephone Encounter (Signed)
Pt.notified

## 2013-12-18 ENCOUNTER — Telehealth: Payer: Self-pay | Admitting: Family Medicine

## 2013-12-18 DIAGNOSIS — R059 Cough, unspecified: Secondary | ICD-10-CM

## 2013-12-18 DIAGNOSIS — R05 Cough: Secondary | ICD-10-CM

## 2013-12-18 MED ORDER — MOMETASONE FUROATE 50 MCG/ACT NA SUSP: NASAL | Status: DC

## 2013-12-18 NOTE — Telephone Encounter (Signed)
Refill req 

## 2014-02-06 ENCOUNTER — Telehealth: Payer: Self-pay | Admitting: Family Medicine

## 2014-02-06 MED ORDER — CLOTRIMAZOLE-BETAMETHASONE 1-0.05 % EX CREA: TOPICAL_CREAM | CUTANEOUS | Status: DC

## 2014-02-06 MED ORDER — CELECOXIB 200 MG PO CAPS: 200.0000 mg | ORAL_CAPSULE | Freq: Every day | ORAL | Status: DC

## 2014-02-06 NOTE — Telephone Encounter (Signed)
Medication sent to pharmacy  

## 2014-02-06 NOTE — Telephone Encounter (Signed)
Patient is needing his Celebrex called into [facility] and his cream? Thanks

## 2014-03-02 ENCOUNTER — Ambulatory Visit: Payer: Medicare Other | Admitting: Family Medicine

## 2014-03-06 ENCOUNTER — Ambulatory Visit: Payer: Medicare Other | Admitting: Family Medicine

## 2014-04-03 ENCOUNTER — Ambulatory Visit: Payer: Medicare Other | Admitting: Family Medicine

## 2014-04-06 ENCOUNTER — Ambulatory Visit (INDEPENDENT_AMBULATORY_CARE_PROVIDER_SITE_OTHER): Payer: Medicare Other | Admitting: Family Medicine

## 2014-04-06 ENCOUNTER — Ambulatory Visit (INDEPENDENT_AMBULATORY_CARE_PROVIDER_SITE_OTHER): Payer: Medicare Other | Admitting: *Deleted

## 2014-04-06 VITALS — BP 174/87 | HR 67 | Wt 168.0 lb

## 2014-04-06 DIAGNOSIS — I10 Essential (primary) hypertension: Secondary | ICD-10-CM | POA: Diagnosis not present

## 2014-04-06 DIAGNOSIS — Z23 Encounter for immunization: Secondary | ICD-10-CM | POA: Diagnosis not present

## 2014-04-06 DIAGNOSIS — M25512 Pain in left shoulder: Secondary | ICD-10-CM

## 2014-04-06 DIAGNOSIS — L299 Pruritus, unspecified: Secondary | ICD-10-CM

## 2014-04-06 DIAGNOSIS — M1612 Unilateral primary osteoarthritis, left hip: Secondary | ICD-10-CM

## 2014-04-06 DIAGNOSIS — E1121 Type 2 diabetes mellitus with diabetic nephropathy: Secondary | ICD-10-CM | POA: Diagnosis not present

## 2014-04-06 MED ORDER — CANAGLIFLOZIN 100 MG PO TABS: 100.0000 mg | ORAL_TABLET | Freq: Every day | ORAL | Status: DC

## 2014-04-06 MED ORDER — LOVASTATIN 40 MG PO TABS: ORAL_TABLET | ORAL | Status: DC

## 2014-04-06 MED ORDER — SITAGLIPTIN PHOSPHATE 100 MG PO TABS: ORAL_TABLET | ORAL | Status: DC

## 2014-04-06 MED ORDER — FENOFIBRATE 48 MG PO TABS: ORAL_TABLET | ORAL | Status: DC

## 2014-04-06 MED ORDER — ENALAPRIL MALEATE 20 MG PO TABS: 20.0000 mg | ORAL_TABLET | Freq: Every day | ORAL | Status: DC

## 2014-04-06 NOTE — Patient Instructions (Signed)
Begin using aspercream on the left shoulder for pain relief.

## 2014-04-08 ENCOUNTER — Encounter: Payer: Self-pay | Admitting: Family Medicine

## 2014-04-08 MED ORDER — PIOGLITAZONE HCL 45 MG PO TABS: ORAL_TABLET | ORAL | Status: DC

## 2014-04-08 NOTE — Progress Notes (Signed)
CC: Alex Zavala is a 79 y.o. male is here for Diabetes   Subjective: HPI:   Follow-up left hip pain:  Describes continued chronic hip pain localized to the anterior lateral aspect of the left hip. It is not radiating. It is present only when walking. It is slightly improved with Celebrex  or when resting while nonweightbearing.  Has not gotten better or worse since I saw him last. Overall he describes it as moderate in severity. It can occur anytime of the day. Denies joint pain elsewhere other than left shoulder described below. He tells me he was never called about physical therapy.   Follow-up type 2 diabetes: continues on metformin  1 g daily. and Actos 45 mg daily. No outside blood sugars to report.  Denies polyuria or polyphagia or polydipsia.  He tells me he was taking invokana and [place]   However he is not sure what was treating and he is not sure when he ran out of it. He denies any known intolerances to these medications.   follow-up essential hypertension: he tells me he ran out of  Enalapril   But he is not sure when. No outside blood pressures report. Denies chest pain shortness of breath orthopnea or peripheral edema. Denies any new motor or sensory disturbances other than itching described below.   Complains of itching of the left scalp localized to the top lateral aspect of the head. It is improved with scratching but only temporarily. No interventions as of yet. He denies any skin changes or hair changes. Dry skin issues elsewhere. He is not sure how long it's been present it is mild in severity.   Complains of left shoulder pain localized on  The anterior lateral aspect of the shoulder.  Nothing particularly makes it  Worse such as particular movements motions or positions. It is greatly improved with what sounds to be Biofreeze or some other  Similar product. Symptoms  Our mild in severity and nonradiating. He denies weakness  Or recent injury.  Review Of Systems Outlined In  HPI  Past Medical History  Diagnosis Date  . Hypertension   . Heart disease   . Diabetes   . Hyperlipidemia     Past Surgical History  Procedure Laterality Date  . Cholecystectomy    . Retropubic prostatectomy     Family History  Problem Relation Age of Onset  . Hyperlipidemia    . Hypertension      History   Social History  . Marital Status: Divorced    Spouse Name: N/A    Number of Children: N/A  . Years of Education: N/A   Occupational History  . Not on file.   Social History Main Topics  . Smoking status: Former Research scientist (life sciences)  . Smokeless tobacco: Not on file     Comment: quit smoking 50 years ago  . Alcohol Use: No  . Drug Use: No  . Sexual Activity: No   Other Topics Concern  . Not on file   Social History Narrative  . No narrative on file     Objective: BP 174/87 mmHg  Pulse 67  Wt 168 lb (76.204 kg)  General: Alert and Oriented, No Acute Distress HEENT: Pupils equal, round, reactive to light. Conjunctivae clear.  External ears unremarkable, canals clear with intact TMs with appropriate landmarks.  Middle ear appears open without effusion. Pink inferior turbinates.  Moist mucous membranes, pharynx without inflammation nor lesions.  Neck supple without palpable lymphadenopathy nor abnormal masses.  Scalp unremarkable Lungs: Clear to auscultation bilaterally, no wheezing/ronchi/rales.  Comfortable work of breathing. Good air movement. Cardiac: Regular rate and rhythm. Normal S1/S2.  No murmurs, rubs, nor gallops.   Left shoulder exam reveals full range of motion and strength in all planes of motion and with individual rotator cuff testing. No overlying redness warmth or swelling.  Neer's test negative.  Hawkins test negative. Empty can negative. Crossarm test negative. O'Brien's test negative. Apprehension test negative. Speed's test negative. Extremities: No peripheral edema.  Strong peripheral pulses.  Mental Status: No depression, anxiety, nor  agitation. Skin: Warm and dry.  Assessment & Plan: Trey was seen today for diabetes.  Diagnoses and associated orders for this visit:  Primary osteoarthritis of left hip  Type 2 diabetes mellitus with diabetic nephropathy - BASIC METABOLIC PANEL WITH GFR - HgB A1c - canagliflozin (INVOKANA) 100 MG TABS tablet; Take 1 tablet (100 mg total) by mouth daily. - lovastatin (MEVACOR) 40 MG tablet; TAKE 2 TABLETS EVERY DAY - sitaGLIPtin (JANUVIA) 100 MG tablet; TAKE 1 TABLET EVERY DAY - pioglitazone (ACTOS) 45 MG tablet; TAKE 1 TABLET EVERY DAY  Essential hypertension, benign - enalapril (VASOTEC) 20 MG tablet; Take 1 tablet (20 mg total) by mouth daily.  Scalp itch  Left shoulder pain  Other Orders - fenofibrate (TRICOR) 48 MG tablet; TAKE 1 TABLET (48 MG TOTAL) BY MOUTH DAILY.    Osteoarthritis of the left hip: Uncontrolled, continue Celebrex, chart reviewed appears that his hearing difficulty complicated him being scheduled for physical therapy. I provided him with detailed instructions to walk directly to our physical therapy department here in our building at the end of our encounter to physically schedule physical therapy for his hip pain. Type 2 diabetes: Continue actos, metformin, invokana, and januvia pending A1c and kidney function. The latter of these two were explained to him that they are being used for blood sugar control. Essential hypertension: Uncontrolled restart enalapril and begin checking blood pressures at home if these numbers stay of 140/90 follow-up with me in one week after starting the enalapril Scalp itch: no clear answer to what is causing this on clinical exam, encouraged to apply Lotrisone twice a day if no benefit follow-up in one week. Left shoulder pain: Unfortunately no clear cause of what is generating his pain, begin Aspercreme and if no better in 1 week follow-up for imaging.  40 minutes spent face-to-face during visit today of which at least 50% was  counseling or coordinating care regarding: 1. Primary osteoarthritis of left hip   2. Type 2 diabetes mellitus with diabetic nephropathy   3. Essential hypertension, benign   4. Scalp itch   5. Left shoulder pain      Return in about 3 months (around 07/06/2014) for Diabetes an Blood Pressure.

## 2014-04-09 ENCOUNTER — Telehealth: Payer: Self-pay | Admitting: Family Medicine

## 2014-04-09 DIAGNOSIS — E1121 Type 2 diabetes mellitus with diabetic nephropathy: Secondary | ICD-10-CM

## 2014-04-09 MED ORDER — PIOGLITAZONE HCL 45 MG PO TABS: ORAL_TABLET | ORAL | Status: DC

## 2014-04-09 NOTE — Telephone Encounter (Signed)
Reordering Actos Rx, order from yesterday was printed instead of being sent to [facility]

## 2014-04-12 ENCOUNTER — Ambulatory Visit (INDEPENDENT_AMBULATORY_CARE_PROVIDER_SITE_OTHER): Payer: Medicare Other | Admitting: Physical Therapy

## 2014-04-12 DIAGNOSIS — M6281 Muscle weakness (generalized): Secondary | ICD-10-CM

## 2014-04-12 DIAGNOSIS — E1121 Type 2 diabetes mellitus with diabetic nephropathy: Secondary | ICD-10-CM | POA: Diagnosis not present

## 2014-04-12 DIAGNOSIS — R262 Difficulty in walking, not elsewhere classified: Secondary | ICD-10-CM | POA: Diagnosis not present

## 2014-04-12 DIAGNOSIS — M1612 Unilateral primary osteoarthritis, left hip: Secondary | ICD-10-CM

## 2014-04-13 ENCOUNTER — Telehealth: Payer: Self-pay | Admitting: Family Medicine

## 2014-04-13 ENCOUNTER — Encounter: Payer: Self-pay | Admitting: *Deleted

## 2014-04-13 LAB — BASIC METABOLIC PANEL WITH GFR
BUN: 21 mg/dL (ref 6–23)
CALCIUM: 9.5 mg/dL (ref 8.4–10.5)
CO2: 28 mEq/L (ref 19–32)
CREATININE: 0.9 mg/dL (ref 0.50–1.35)
Chloride: 104 mEq/L (ref 96–112)
GFR, EST NON AFRICAN AMERICAN: 83 mL/min
Glucose, Bld: 188 mg/dL — ABNORMAL HIGH (ref 70–99)
Potassium: 4.5 mEq/L (ref 3.5–5.3)
Sodium: 139 mEq/L (ref 135–145)

## 2014-04-13 LAB — HEMOGLOBIN A1C
Hgb A1c MFr Bld: 7.5 % — ABNORMAL HIGH (ref <5.7)
Mean Plasma Glucose: 169 mg/dL — ABNORMAL HIGH (ref <117)

## 2014-04-13 NOTE — Telephone Encounter (Signed)
Alex Zavala, Will you please let patient know that his blood sugar remains moderately elevated therefore I'd recommend he continue metformin, pioglitazone, sitagliptin, and canagliflozin.   (I believe he had run out of the last two of these medications within the last 3 months which is why his A1c is elevated, refills have already been provided.)

## 2014-04-13 NOTE — Telephone Encounter (Signed)
Letter mailed; pt is hard of hearing

## 2014-05-09 ENCOUNTER — Encounter: Payer: Medicare Other | Admitting: Physical Therapy

## 2014-06-26 ENCOUNTER — Telehealth: Payer: Self-pay | Admitting: Family Medicine

## 2014-06-26 MED ORDER — CLOTRIMAZOLE-BETAMETHASONE 1-0.05 % EX CREA: TOPICAL_CREAM | CUTANEOUS | Status: DC

## 2014-06-26 NOTE — Telephone Encounter (Signed)
Patient needs refill on cream. Can you please put several refills on this script for him?  Thanks  Please send to [facility] pharmacy

## 2014-06-26 NOTE — Telephone Encounter (Signed)
Rx sent to [facility]

## 2014-07-04 ENCOUNTER — Telehealth: Payer: Self-pay | Admitting: Family Medicine

## 2014-07-04 ENCOUNTER — Other Ambulatory Visit: Payer: Self-pay | Admitting: *Deleted

## 2014-07-04 MED ORDER — METFORMIN HCL 1000 MG PO TABS
1000.0000 mg | ORAL_TABLET | Freq: Two times a day (BID) | ORAL | Status: DC
Start: 2014-07-04 — End: 2014-07-18

## 2014-07-04 NOTE — Telephone Encounter (Signed)
Done

## 2014-07-04 NOTE — Telephone Encounter (Signed)
Metformin refill sent to [facility] please.  thanks

## 2014-07-06 ENCOUNTER — Telehealth: Payer: Self-pay | Admitting: Family Medicine

## 2014-07-06 ENCOUNTER — Ambulatory Visit: Payer: Medicare Other | Admitting: Family Medicine

## 2014-07-06 DIAGNOSIS — E119 Type 2 diabetes mellitus without complications: Secondary | ICD-10-CM

## 2014-07-06 NOTE — Telephone Encounter (Signed)
Patient was late to his appt today, offered labs since I would not be able to see him with my already booked schedule.  Reschedule at his convenience.

## 2014-07-07 LAB — HEMOGLOBIN A1C
Hgb A1c MFr Bld: 7.8 % — ABNORMAL HIGH (ref <5.7)
MEAN PLASMA GLUCOSE: 177 mg/dL — AB (ref <117)

## 2014-07-09 LAB — MICROALBUMIN / CREATININE URINE RATIO

## 2014-07-11 ENCOUNTER — Encounter: Payer: Self-pay | Admitting: *Deleted

## 2014-07-18 ENCOUNTER — Encounter: Payer: Self-pay | Admitting: Family Medicine

## 2014-07-18 ENCOUNTER — Other Ambulatory Visit: Payer: Self-pay | Admitting: Family Medicine

## 2014-07-18 ENCOUNTER — Ambulatory Visit (INDEPENDENT_AMBULATORY_CARE_PROVIDER_SITE_OTHER): Payer: Medicare Other | Admitting: Family Medicine

## 2014-07-18 VITALS — BP 158/88 | HR 91 | Wt 160.0 lb

## 2014-07-18 DIAGNOSIS — M545 Low back pain, unspecified: Secondary | ICD-10-CM

## 2014-07-18 DIAGNOSIS — M51369 Other intervertebral disc degeneration, lumbar region without mention of lumbar back pain or lower extremity pain: Secondary | ICD-10-CM | POA: Insufficient documentation

## 2014-07-18 DIAGNOSIS — E119 Type 2 diabetes mellitus without complications: Secondary | ICD-10-CM | POA: Diagnosis not present

## 2014-07-18 DIAGNOSIS — I10 Essential (primary) hypertension: Secondary | ICD-10-CM | POA: Diagnosis not present

## 2014-07-18 DIAGNOSIS — I2510 Atherosclerotic heart disease of native coronary artery without angina pectoris: Secondary | ICD-10-CM

## 2014-07-18 DIAGNOSIS — M5136 Other intervertebral disc degeneration, lumbar region: Secondary | ICD-10-CM | POA: Insufficient documentation

## 2014-07-18 DIAGNOSIS — E1121 Type 2 diabetes mellitus with diabetic nephropathy: Secondary | ICD-10-CM

## 2014-07-18 MED ORDER — ENALAPRIL MALEATE 20 MG PO TABS: 30.0000 mg | ORAL_TABLET | Freq: Every day | ORAL | Status: DC

## 2014-07-18 MED ORDER — SITAGLIPTIN PHOS-METFORMIN HCL 50-1000 MG PO TABS: 1.0000 | ORAL_TABLET | Freq: Two times a day (BID) | ORAL | Status: DC

## 2014-07-18 MED ORDER — PIOGLITAZONE HCL 45 MG PO TABS: ORAL_TABLET | ORAL | Status: DC

## 2014-07-18 MED ORDER — MELOXICAM 7.5 MG PO TABS: 7.5000 mg | ORAL_TABLET | Freq: Every day | ORAL | Status: DC

## 2014-07-18 NOTE — Progress Notes (Signed)
CC: Alex Zavala is a 79 y.o. male is here for adjust DM meds   Subjective: HPI:  Follow-up type 2 diabetes: No outside blood sugars to report. Continues on metformin, Actos, Invokana.  He stopped taking Januvia sometime since I saw him last for reasons that are unclear. He denies any intolerance or side effects to the medications. No formal exercise routine but tries to stay as active as possible with hobbies.  Follow-up essential hypertension: Taking enalapril 20 mg daily. No outside blood pressures report. He denies chest pain shortness of breath orthopnea nor peripheral edema. No motor or sensory disturbances.  Follow-up CAD: Continues to take lovastatin on a daily basis along with a baby aspirin. No exertional chest pain nor limb claudication.  Complains of chronic back pain localized in the midline of the lumbar spine that has been present for decades. He's been using Excedrin to help with pain however it seems that it's been more ineffective over the past few months. He denies any recent over exertion or trauma. There's been no radiation of the pain it seems to be worse  When trying to fall sleep at night. No other interventions as of yet. Denies saddle paresthesia nor motor or sensory disturbances in the lower extremities  Review Of Systems Outlined In HPI  Past Medical History  Diagnosis Date  . Hypertension   . Heart disease   . Diabetes   . Hyperlipidemia     Past Surgical History  Procedure Laterality Date  . Cholecystectomy    . Retropubic prostatectomy     Family History  Problem Relation Age of Onset  . Hyperlipidemia    . Hypertension      History   Social History  . Marital Status: Divorced    Spouse Name: N/A  . Number of Children: N/A  . Years of Education: N/A   Occupational History  . Not on file.   Social History Main Topics  . Smoking status: Former Research scientist (life sciences)  . Smokeless tobacco: Not on file     Comment: quit smoking 50 years ago  . Alcohol Use:  No  . Drug Use: No  . Sexual Activity: No   Other Topics Concern  . Not on file   Social History Narrative     Objective: BP 158/88 mmHg  Pulse 91  Wt 160 lb (72.576 kg)  General: Alert and Oriented, No Acute Distress HEENT: Pupils equal, round, reactive to light. Conjunctivae clear.  Moist mucous membranes pharynx unremarkable Lungs: Clear to auscultation bilaterally, no wheezing/ronchi/rales.  Comfortable work of breathing. Good air movement. Cardiac: Regular rate and rhythm. Normal S1/S2.  No murmurs, rubs, nor gallops.   Extremities: No peripheral edema.  Strong peripheral pulses.  Mental Status: No depression, anxiety, nor agitation. Skin: Warm and dry.  Assessment & Plan: Nekoda was seen today for adjust dm meds.  Diagnoses and all orders for this visit:  Type 2 diabetes mellitus with diabetic nephropathy Orders: -     pioglitazone (ACTOS) 45 MG tablet; TAKE 1 TABLET EVERY DAY  Essential hypertension, benign Orders: -     enalapril (VASOTEC) 20 MG tablet; Take 1.5 tablets (30 mg total) by mouth daily.  Atherosclerosis of native coronary artery of native heart without angina pectoris  Midline low back pain without sciatica  Other orders -     Cancel: metFORMIN (GLUCOPHAGE) 1000 MG tablet; Take 1 tablet (1,000 mg total) by mouth 2 (two) times daily. -     Cancel: sitaGLIPtin (JANUVIA) 100 MG  tablet; TAKE 1 TABLET EVERY DAY -     sitaGLIPtin-metformin (JANUMET) 50-1000 MG per tablet; Take 1 tablet by mouth 2 (two) times daily with a meal. -     meloxicam (MOBIC) 7.5 MG tablet; Take 1 tablet (7.5 mg total) by mouth daily.   Type 2 diabetes: Uncontrolled chronic condition. Metformin and replaced with Janumet. Continue Actos and invokanna. Essential hypertension: Uncontrolled increasing enalapril Coronary artery disease: Controlled continue statin and baby aspirin, he is taking 2 baby aspirins however I recommend he only needs to take 1 daily. Midline low back pain:  Start as needed meloxicam. Signs and symptoms requring emergent/urgent reevaluation were discussed with the patient.   Return in about 3 months (around 10/17/2014) for DM.

## 2014-07-18 NOTE — Patient Instructions (Signed)
Start Zyrtec 10mg  over the counter daily to reduce eye irritation and mucus production.

## 2014-07-19 LAB — MICROALBUMIN / CREATININE URINE RATIO
Creatinine, Urine: 130.2 mg/dL
Microalb Creat Ratio: 92.2 mg/g — ABNORMAL HIGH (ref 0.0–30.0)
Microalb, Ur: 12 mg/dL — ABNORMAL HIGH (ref <2.0)

## 2014-08-03 ENCOUNTER — Encounter: Payer: Self-pay | Admitting: *Deleted

## 2014-08-03 ENCOUNTER — Emergency Department
Admission: EM | Admit: 2014-08-03 | Discharge: 2014-08-03 | Disposition: A | Discharge status: Home / Self Care | Payer: Medicare Other | Source: Home / Self Care | Attending: Emergency Medicine | Admitting: Emergency Medicine

## 2014-08-03 ENCOUNTER — Emergency Department (INDEPENDENT_AMBULATORY_CARE_PROVIDER_SITE_OTHER): Payer: Medicare Other

## 2014-08-03 DIAGNOSIS — I7090 Unspecified atherosclerosis: Secondary | ICD-10-CM

## 2014-08-03 DIAGNOSIS — B029 Zoster without complications: Secondary | ICD-10-CM

## 2014-08-03 DIAGNOSIS — S20212A Contusion of left front wall of thorax, initial encounter: Secondary | ICD-10-CM

## 2014-08-03 DIAGNOSIS — R0781 Pleurodynia: Secondary | ICD-10-CM | POA: Diagnosis not present

## 2014-08-03 DIAGNOSIS — I771 Stricture of artery: Secondary | ICD-10-CM | POA: Diagnosis not present

## 2014-08-03 MED ORDER — VALACYCLOVIR HCL 1 G PO TABS: 1000.0000 mg | ORAL_TABLET | Freq: Three times a day (TID) | ORAL | Status: DC

## 2014-08-03 MED ORDER — HYDROCODONE-ACETAMINOPHEN 5-325 MG PO TABS: 1.0000 | ORAL_TABLET | Freq: Four times a day (QID) | ORAL | Status: DC | PRN

## 2014-08-03 MED ORDER — IBUPROFEN 200 MG PO TABS: ORAL_TABLET | ORAL | Status: DC

## 2014-08-03 NOTE — ED Notes (Signed)
Pt reports falling onto coffee table on left side 3 weeks ago. He also c/o shingles-like rash to right side x yesterday. C/o pain @ site.

## 2014-08-03 NOTE — Discharge Instructions (Signed)
Rib Contusion A rib contusion (bruise) can occur by a blow to the chest or by a fall against a hard object. Usually these will be much better in a couple weeks. If X-rays were taken today and there are no broken bones (fractures), the diagnosis of bruising is made.----X-ray left ribs today show no broken ribs   HOME CARE INSTRUCTIONS   Avoid strenuous activity. Be careful during activities and avoid bumping the injured ribs. Activities that pull on the injured ribs and cause pain should be avoided, if possible.  For the first day or two, an ice pack used every 20 minutes while awake may be helpful. Put ice in a plastic bag and put a towel between the bag and the skin.  Eat a normal, well-balanced diet. Drink plenty of fluids to avoid constipation.  Take deep breaths several times a day to keep lungs free of infection. Try to cough several times a day. Splint the injured area with a pillow while coughing to ease pain. Coughing can help prevent pneumonia.  Wear a rib belt or binder only if told to do so by your caregiver. If you are wearing a rib belt or binder, you must do the breathing exercises as directed by your caregiver. If not used properly, rib belts or binders restrict breathing which can lead to pneumonia.  Only take over-the-counter or prescription medicines for pain, discomfort, or fever as directed by your caregiver. SEEK MEDICAL CARE IF:   You or your child has an oral temperature above 102 F (38.9 C).  Your baby is older than 3 months with a rectal temperature of 100.5 F (38.1 C) or higher for more than 1 day.  You develop a cough, with thick or bloody sputum. SEEK IMMEDIATE MEDICAL CARE IF:   You have difficulty breathing.  You feel sick to your stomach (nausea), have vomiting or belly (abdominal) pain.  You have worsening pain, not controlled with medications, or there is a change in the location of the pain.  You develop sweating or radiation of the pain into the  arms, jaw or shoulders, or become light headed or faint.  You or your child has an oral temperature above 102 F (38.9 C), not controlled by medicine.  Your or your baby is older than 3 months with a rectal temperature of 102 F (38.9 C) or higher.  Your baby is 69 months old or younger with a rectal temperature of 100.4 F (38 C) or higher. MAKE SURE YOU:   Understand these instructions.  Will watch your condition.  Will get help right away if you are not doing well or get worse. Document Released: 12/09/2000 Document Revised: 07/11/2012 Document Reviewed: 11/02/2007 [facility] Patient Information 2015 [place], [place]. This information is not intended to replace advice given to you by your health care provider. Make sure you discuss any questions you have with your health care provider. Shingles Shingles is caused by the same virus that causes chickenpox. The first feelings may be pain or tingling. A rash will follow in a couple days. The rash may occur on any area of the body. Long-lasting pain is more likely in an elderly person. It can last months to years. There are medicines that can help prevent pain if you start taking them early. HOME CARE   Take cool baths or place cool cloths on the rash as told by your doctor.  Take medicine only as told by your doctor.  Rest as told by your doctor.  Keep  your rash clean with mild soap and cool water or as told by your doctor.  Do not scratch your rash. You may use calamine lotion to relieve itchy skin as told by your doctor.  Keep your rash covered with a loose bandage (dressing).  Avoid touching:  Babies.  Pregnant women.  Children with inflamed skin (eczema).  People who have gotten organ transplants.  People with chronic illnesses, such as leukemia or AIDS.  Wear loose-fitting clothing.  If the rash is on the face, you may need to see a specialist. Keep all appointments. Shingles must be kept away from the eyes, if  possible.  Keep all follow-up visits as told by your doctor. GET HELP RIGHT AWAY IF:   You have any pain on the face or eye.  You lose feeling on one side of your face.  You have ear pain or ringing in your ear.  You cannot taste as well.  Your medicines do not help the pain.  Your redness or puffiness (swelling) spreads.  You feel like you are getting worse.  You have a fever. MAKE SURE YOU:   Understand these instructions.  Will watch your condition.  Will get help right away if you are not doing well or get worse. Document Released: 09/02/2007 Document Revised: 07/31/2013 Document Reviewed: 09/02/2007 [facility] Patient Information 2015 [place], [place]. This information is not intended to replace advice given to you by your health care provider. Make sure you discuss any questions you have with your health care provider.

## 2014-08-03 NOTE — ED Provider Notes (Signed)
CSN: [id number]     Arrival date & time 08/03/14  1157 History   First MD Initiated Contact with Patient 08/03/14 1216    [facility] Friday 08/03/14 Chief Complaint  Patient presents with  . Rib pain    . Rash   PCP is Dr. Ileene Rubens. Patient tried to get appointment today but was unable to get appointment and he was advised by Dr. Lajoyce Lauber office to come here to [facility] urgent care.  HPI Chief complaint #1 Pt reports falling onto coffee table on left side 3 weeks ago. Complains of moderate sharp pain left ribs, worse to move or touch. No breathing problems. No exertional chest pain or shortness of breath.   Chief complaint #2 He also c/o shingles-like rash to right side x yesterday. C/o pain @ site. Starting to blister.  Denies fever chills were nausea or vomiting. Denies any new focal neurologic symptoms. He wears hearing aids chronically and is hard of hearing, but that has not changed acutely. Past Medical History  Diagnosis Date  . Hypertension   . Heart disease   . Diabetes   . Hyperlipidemia    Past Surgical History  Procedure Laterality Date  . Cholecystectomy    . Retropubic prostatectomy     Family History  Problem Relation Age of Onset  . Hyperlipidemia    . Hypertension     History  Substance Use Topics  . Smoking status: Former Research scientist (life sciences)  . Smokeless tobacco: Not on file     Comment: quit smoking 50 years ago  . Alcohol Use: No    Review of Systems Remainder of Review of Systems negative for acute change except as noted in the HPI.  Allergies  Review of patient's allergies indicates no known allergies.  Home Medications   Prior to Admission medications   Medication Sig Start Date End Date Taking? Authorizing Provider  aspirin 81 MG tablet Take 81 mg by mouth daily.    Historical Provider, MD  canagliflozin (INVOKANA) 100 MG TABS tablet Take 1 tablet (100 mg total) by mouth daily. 04/06/14   Sean Hommel, DO  clotrimazole-betamethasone (LOTRISONE)  cream APPLY TO AFFECTED AREA TWICE A DAY FOR TWO WEEKS, MAY REQUIRE FOUR WEEKS IF INVOLVING THE FEET/TOES. Patient not taking: Reported on 07/18/2014 06/26/14   Marcial Pacas, DO  enalapril (VASOTEC) 20 MG tablet Take 1.5 tablets (30 mg total) by mouth daily. 07/18/14   Sean Hommel, DO  fenofibrate (TRICOR) 48 MG tablet TAKE 1 TABLET (48 MG TOTAL) BY MOUTH DAILY. 04/06/14   Marcial Pacas, DO  HYDROcodone-acetaminophen (NORCO/VICODIN) 5-325 MG per tablet Take 1-2 tablets by mouth every 6 (six) hours as needed for severe pain. Take with food. 08/03/14   Jacqulyn Cane, MD  ibuprofen (ADVIL,MOTRIN) 200 MG tablet Take three tablets ( 600 milligrams total) every 6 with food as needed for moderate pain. 08/03/14   Jacqulyn Cane, MD  lovastatin (MEVACOR) 40 MG tablet TAKE 2 TABLETS EVERY DAY 04/06/14   Marcial Pacas, DO  meloxicam (MOBIC) 7.5 MG tablet Take 1 tablet (7.5 mg total) by mouth daily. 07/18/14   Sean Hommel, DO  metoprolol (LOPRESSOR) 100 MG tablet TAKE 1 TABLET (100 MG TOTAL) BY MOUTH DAILY. 10/03/13   Sean Hommel, DO  mometasone (NASONEX) 50 MCG/ACT nasal spray Two sprays each nostril daily. To help prevent cough. 12/18/13   Sean Hommel, DO  pantoprazole (PROTONIX) 40 MG tablet Take 1 tablet (40 mg total) by mouth daily. 10/03/13   Marcial Pacas, DO  pioglitazone (  ACTOS) 45 MG tablet TAKE 1 TABLET EVERY DAY 07/18/14   Marcial Pacas, DO  ranolazine (RANEXA) 500 MG 12 hr tablet Take 1 tablet (500 mg total) by mouth 2 (two) times daily. 11/30/12   Marcial Pacas, DO  sitaGLIPtin-metformin (JANUMET) 50-1000 MG per tablet Take 1 tablet by mouth 2 (two) times daily with a meal. 07/18/14   Sean Hommel, DO  valACYclovir (VALTREX) 1000 MG tablet Take 1 tablet (1,000 mg total) by mouth 3 (three) times daily. For 7 days. For shingles 08/03/14   Jacqulyn Cane, MD   BP 171/106 mmHg  Pulse 84  Temp(Src) 97.7 F (36.5 C) (Oral)  Resp 14  Wt 154 lb (69.854 kg)  SpO2 100% Physical Exam  Constitutional: He is oriented to person, place, and  time. He appears well-developed and well-nourished. No distress.  Appears uncomfortable from shingles rash right side of chest, but he is alert and cooperative. No distress. He is very hard of hearing and uses bilateral hearing aids. He is able to hear well enough for a conversation when I talk very loudly.  HENT:  Head: Normocephalic and atraumatic.  Eyes: Conjunctivae and EOM are normal. Pupils are equal, round, and reactive to light. No scleral icterus.  Neck: Normal range of motion.  Cardiovascular: Normal rate.   Pulmonary/Chest: Effort normal and breath sounds normal.  Tender left ribs without deformity or ecchymosis.  Pulse ox 100% on room air  Abdominal: Soft. He exhibits no distension. There is no tenderness.  Musculoskeletal: Normal range of motion.  Neurological: He is alert and oriented to person, place, and time.  Skin: Skin is warm. Rash noted.     Psychiatric: He has a normal mood and affect.  Nursing note and vitals reviewed. BP rechecked 163/96  ED Course  Procedures   Imaging Review LEFT RIBS AND CHEST - 3+ VIEW COMPARISON: Chest CT 01/05/2013, plain film 12/12/2012 FINDINGS: Cardiomediastinal silhouette unchanged. Tortuosity descending thoracic aorta. No pulmonary vascular congestion. No confluent airspace disease, pneumothorax, or pleural effusion. No displaced fracture. No angulation at the fiducial marker of the underlying ribs. Degenerative changes present. No aggressive bony lesions identified. Degenerative changes of the spine. Dense calcifications of the abdominal aorta and splenic vasculature. Degenerative changes of the bilateral shoulders including right acromioclavicular joint.  IMPRESSION: No radiographic evidence of acute cardiopulmonary disease. No displaced rib fracture identified. Signed, Dulcy Fanny. Earleen Newport, DO [facility] Radiology Electronically Signed  By: Corrie Mckusick  D.O.  On: 08/03/2014 13:00  MDM   1. Shingles outbreak -R Chest  2. Contusion of ribs, left, initial encounter    X-ray left ribs and PA chest showed no evidence of acute cardiopulmonary disease. No rib fracture seen.  Treatment options discussed, as well as risks, benefits, alternatives. He declined rib belt Patient voiced understanding and agreement with the following plans: Symptomatic care for the left rib contusion. Tylenol or ibuprofen. Mild to moderate pain. Vicodin PRN severe pain, precautions discussed. Discharge Medication List as of 08/03/2014  1:09 PM    START taking these medications   Details  HYDROcodone-acetaminophen (NORCO/VICODIN) 5-325 MG per tablet Take 1-2 tablets by mouth every 6 (six) hours as needed for severe pain. Take with food., Starting 08/03/2014, Until Discontinued, Print    ibuprofen (ADVIL,MOTRIN) 200 MG tablet Take three tablets ( 600 milligrams total) every 6 with food as needed for moderate pain., No Print    valACYclovir (VALTREX) 1000 MG tablet Take 1 tablet (1,000 mg total) by mouth 3 (three)  times daily. For 7 days. For shingles, Starting 08/03/2014, Until Discontinued, Print       Advised to have BP recheck with PCP within 7 to 10 days. Follow-up with your primary care doctor in 5-7 days if not improving, or sooner if symptoms become worse. Precautions discussed. Red flags discussed. Questions invited and answered. Patient voiced understanding and agreement.     Jacqulyn Cane, MD 08/05/14 [phone]

## 2014-08-09 ENCOUNTER — Ambulatory Visit (INDEPENDENT_AMBULATORY_CARE_PROVIDER_SITE_OTHER): Payer: Medicare Other | Admitting: Family Medicine

## 2014-08-09 ENCOUNTER — Encounter: Payer: Self-pay | Admitting: Family Medicine

## 2014-08-09 VITALS — BP 171/93 | HR 78 | Wt 159.0 lb

## 2014-08-09 DIAGNOSIS — M25512 Pain in left shoulder: Secondary | ICD-10-CM | POA: Diagnosis not present

## 2014-08-09 DIAGNOSIS — W19XXXA Unspecified fall, initial encounter: Secondary | ICD-10-CM

## 2014-08-09 DIAGNOSIS — B029 Zoster without complications: Secondary | ICD-10-CM

## 2014-08-09 DIAGNOSIS — R2689 Other abnormalities of gait and mobility: Secondary | ICD-10-CM

## 2014-08-09 DIAGNOSIS — R29818 Other symptoms and signs involving the nervous system: Secondary | ICD-10-CM | POA: Diagnosis not present

## 2014-08-09 MED ORDER — CLOTRIMAZOLE-BETAMETHASONE 1-0.05 % EX CREA: TOPICAL_CREAM | CUTANEOUS | Status: DC

## 2014-08-09 MED ORDER — METFORMIN HCL 1000 MG PO TABS: ORAL_TABLET | ORAL | Status: DC

## 2014-08-09 NOTE — Progress Notes (Signed)
CC: Alex Zavala is a 79 y.o. male is here for f/u UC   Subjective: HPI:  Follow-up shingles: Continues to take Valtrex 3 times a day. He states that the pain has improved greatly and the appearance has mildly improved. He's also been using clotrimazole/betamethasone cream which she believes is helping reduce the pain. He denies any new skin outbreaks. Denies any fevers, chills, swollen lymph nodes nor new skin pain.  About 4 weeks ago he fell onto his right torso striking the corner of a table. He's had persistent right rib pain ever since then however it's improving since he saw urgent care last week and had an x-ray that was unremarkable. He hasn't had any other falls but he tells me he's had a few close calls and he even feels like his balance is not as good as it used to be. He denies any weakness nor sensory disturbances other than chronic hearing loss. He denies any dizziness or lightheadedness. He is not willing to do outpatient physical therapy because of having to pay $40 each visit.  Complains of left shoulder pain that has been present for the last few months. It was present prior to his fall and has not got better or worse since the fall. It is described as a sharpness on the top of the shoulder that radiates down the lateral aspect of the proximal arm. It's worse when driving but can happen anytime of the day even at rest. Alex Zavala and comes and goes without any warning. Nothing particularly makes it better that he knows of he denies any decreased range of motion or weakness in the upper extremities.     Review Of Systems Outlined In HPI  Past Medical History  Diagnosis Date  . Hypertension   . Heart disease   . Diabetes   . Hyperlipidemia     Past Surgical History  Procedure Laterality Date  . Cholecystectomy    . Retropubic prostatectomy     Family History  Problem Relation Age of Onset  . Hyperlipidemia    . Hypertension      History   Social History  . Marital  Status: Divorced    Spouse Name: N/A  . Number of Children: N/A  . Years of Education: N/A   Occupational History  . Not on file.   Social History Main Topics  . Smoking status: Former Research scientist (life sciences)  . Smokeless tobacco: Not on file     Comment: quit smoking 50 years ago  . Alcohol Use: No  . Drug Use: No  . Sexual Activity: No   Other Topics Concern  . Not on file   Social History Narrative     Objective: BP 171/93 mmHg  Pulse 78  Wt 159 lb (72.122 kg)  SpO2 95%  General: Alert and Oriented, No Acute Distress HEENT: Pupils equal, round, reactive to light. Conjunctivae clear.  Moist membranes Lungs: clear comfortable work of breathing Cardiac: Regular rate and rhythm.  Abdomen: soft nontender Extremities: No peripheral edema.  Strong peripheral pulses.  Mental Status: No depression, anxiety, nor agitation. Left shoulder exam reveals full range of motion and strength in all planes of motion and with individual rotator cuff testing. No overlying redness warmth or swelling.  Neer's test negative.  Hawkins test negative. Apprehension test negative. Speed's test negative. Skin: Warm and dry. 6 mm inner diameter cluster of unroofed vesicles with moderate surrounding erythema localized in the right upper quadrant of the abdomen slightly tender to touch without sign  of bacterial infection  Assessment & Plan: Alex Zavala was seen today for f/u uc.  Diagnoses and all orders for this visit:  Shingles  Falls, initial encounter Orders: -     Ambulatory referral to Home Health  Left shoulder pain Orders: -     Ambulatory referral to [place] problem Orders: -     Ambulatory referral to [place]  Other orders -     clotrimazole-betamethasone (LOTRISONE) cream; APPLY TO AFFECTED AREA TWICE A DAY FOR TWO WEEKS, MAY REQUIRE FOUR WEEKS IF INVOLVING THE FEET/TOES. -     metFORMIN (GLUCOPHAGE) 1000 MG tablet; One by mouth twice a day for blood sugar control.   Shingles:  Improving continue Valtrex, prescription provided for larger amount of betamethasone, discussed the clotrimazole is not providing any resolution of symptoms but he would still prefer to have the combination for when he uses this on tinea. Falls and balance issue: Referral to home health physical therapy. 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 Will have physical therapy attempt to help rehabilitate. He was politely unwilling to provide exam further than that documented above  I would like him to restart Invokana, that should help with blood pressure.  Return in about 3 months (around 11/09/2014) for sugar. Follow up sooner blood pressure does not return to below 140/90

## 2014-08-27 ENCOUNTER — Encounter: Payer: Self-pay | Admitting: Emergency Medicine

## 2014-08-27 ENCOUNTER — Emergency Department (INDEPENDENT_AMBULATORY_CARE_PROVIDER_SITE_OTHER): Payer: Medicare Other

## 2014-08-27 ENCOUNTER — Emergency Department
Admission: EM | Admit: 2014-08-27 | Discharge: 2014-08-27 | Disposition: A | Discharge status: Home / Self Care | Payer: Medicare Other | Source: Home / Self Care | Attending: Family Medicine | Admitting: Family Medicine

## 2014-08-27 DIAGNOSIS — M47892 Other spondylosis, cervical region: Secondary | ICD-10-CM | POA: Diagnosis not present

## 2014-08-27 DIAGNOSIS — M47812 Spondylosis without myelopathy or radiculopathy, cervical region: Secondary | ICD-10-CM | POA: Diagnosis not present

## 2014-08-27 DIAGNOSIS — M5412 Radiculopathy, cervical region: Secondary | ICD-10-CM | POA: Diagnosis not present

## 2014-08-27 DIAGNOSIS — S199XXA Unspecified injury of neck, initial encounter: Secondary | ICD-10-CM | POA: Diagnosis not present

## 2014-08-27 MED ORDER — PREDNISONE 20 MG PO TABS: ORAL_TABLET | ORAL | Status: DC

## 2014-08-27 NOTE — Discharge Instructions (Signed)
Apply ice pack for 15 minutes, 3 to 4 times daily.  Wear collar daytime.   Cervical Radiculopathy Cervical radiculopathy happens when a nerve in the neck is pinched or bruised by a slipped (herniated) disk or by arthritic changes in the bones of the cervical spine. This can occur due to an injury or as part of the normal aging process. Pressure on the cervical nerves can cause pain or numbness that runs from your neck all the way down into your arm and fingers. CAUSES  There are many possible causes, including:  Injury.  Muscle tightness in the neck from overuse.  Swollen, painful joints (arthritis).  Breakdown or degeneration in the bones and joints of the spine (spondylosis) due to aging.  Bone spurs that may develop near the cervical nerves. SYMPTOMS  Symptoms include pain, weakness, or numbness in the affected arm and hand. Pain can be severe or irritating. Symptoms may be worse when extending or turning the neck. DIAGNOSIS  Your caregiver will ask about your symptoms and do a physical exam. He or she may test your strength and reflexes. X-rays, CT scans, and MRI scans may be needed in cases of injury or if the symptoms do not go away after a period of time. Electromyography (EMG) or nerve conduction testing may be done to study how your nerves and muscles are working. TREATMENT  Your caregiver may recommend certain exercises to help relieve your symptoms. Cervical radiculopathy can, and often does, get better with time and treatment. If your problems continue, treatment options may include:  Wearing a soft collar for short periods of time.  Physical therapy to strengthen the neck muscles.  Medicines, such as nonsteroidal anti-inflammatory drugs (NSAIDs), oral corticosteroids, or spinal injections.  Surgery. Different types of surgery may be done depending on the cause of your problems. HOME CARE INSTRUCTIONS   Put ice on the affected area.  Put ice in a plastic bag.  Place a  towel between your skin and the bag.  Leave the ice on for 15-20 minutes, 03-04 times a day or as directed by your caregiver.  If ice does not help, you can try using heat. Take a warm shower or bath, or use a hot water bottle as directed by your caregiver.  You may try a gentle neck and shoulder massage.  Use a flat pillow when you sleep.  Only take over-the-counter or prescription medicines for pain, discomfort, or fever as directed by your caregiver.  If physical therapy was prescribed, follow your caregiver's directions.  If a soft collar was prescribed, use it as directed. SEEK IMMEDIATE MEDICAL CARE IF:   Your pain gets much worse and cannot be controlled with medicines.  You have weakness or numbness in your hand, arm, face, or leg.  You have a high fever or a stiff, rigid neck.  You lose bowel or bladder control (incontinence).  You have trouble with walking, balance, or speaking. MAKE SURE YOU:   Understand these instructions.  Will watch your condition.  Will get help right away if you are not doing well or get worse. Document Released: 12/09/2000 Document Revised: 06/08/2011 Document Reviewed: 10/28/2010 [facility] Patient Information 2015 [place], [place]. This information is not intended to replace advice given to you by your health care provider. Make sure you discuss any questions you have with your health care provider.   Cervical Collar A cervical collar is used to hold the head and neck still. This may be a soft, thick collar or a  more rigid hard collar. This can keep your sore neck muscles from hurting. The collar also protects you in case a more serious injury of the neck is present and can not be seen yet on an x-ray. FOLLOW THESE INSTRUCTIONS FOR COLLAR USE:  Attach the Velcro tab in back.  Make it tight enough to support part of the weight of your chin.  Do not make it so tight that it hurts or makes it hard to breathe.  Wear it until you are  comfortable without it or as instructed. HOME CARE INSTRUCTIONS   Ice packs to the neck or areas of pain approximately 03-04 times a day for 15-20 minutes while awake. Do this for 2 days. Ask your physician if you may remove the cervical collar for bathing or ice.  Do not remove any collar unless instructed by a caregiver. Ask if the collar may be removed for showering or eating.  Only take over-the-counter or prescription medicines for pain, discomfort, or fever as directed by your caregiver.  Do not drive a car until given permission by your caregiver.  Follow all instructions for follow-up with your caregiver. This includes any referrals, physical therapy, and rehabilitation. Any delay in obtaining necessary care could result in a delay or failure of the injury to heal properly. SEEK IMMEDIATE MEDICAL CARE IF:   You develop increasing pain.  You develop problems using your arms or legs or have tingling or other funny feelings in them.  You develop loss of strength in either of your arms or legs or have difficulty walking.  You develop a fever or shaking chills.  You lose control of your bowels or bladder. MAKE SURE YOU:   Understand these instructions.  Will watch your condition.  Will get help right away if you are not doing well or get worse. Document Released: 12/07/2003 Document Revised: 06/08/2011 Document Reviewed: 11/07/2007 [facility] Patient Information 2015 [place], [place]. This information is not intended to replace advice given to you by your health care provider. Make sure you discuss any questions you have with your health care provider.

## 2014-08-27 NOTE — ED Notes (Signed)
Reports onset of pain from top of head down through neck and left should last night. No known activity/injury.

## 2014-08-27 NOTE — ED Provider Notes (Signed)
CSN: [id number]     Arrival date & time 08/27/14  1618 History   First MD Initiated Contact with Patient 08/27/14 1638     Chief Complaint  Patient presents with  . Neck Pain      HPI Comments: Patient complains of onset of left neck pain last night radiating to his left shoulder/upper arm to elbow, and left scapula.  The pain is worse when he extends his neck and flexes his neck to the left.  No injury. He denies weakness in his extremities.  The history is provided by the patient.    Past Medical History  Diagnosis Date  . Hypertension   . Heart disease   . Diabetes   . Hyperlipidemia    Past Surgical History  Procedure Laterality Date  . Cholecystectomy    . Retropubic prostatectomy     Family History  Problem Relation Age of Onset  . Hyperlipidemia    . Hypertension     History  Substance Use Topics  . Smoking status: Former Research scientist (life sciences)  . Smokeless tobacco: Not on file     Comment: quit smoking 50 years ago  . Alcohol Use: No    Review of Systems  Constitutional: Negative for fever, chills, diaphoresis, activity change, appetite change and fatigue.  HENT: Negative.   Eyes: Negative.   Respiratory: Negative.   Cardiovascular: Negative.   Gastrointestinal: Negative.   Genitourinary: Negative.   Musculoskeletal: Positive for back pain, neck pain and neck stiffness.       Left shoulder pain  Skin: Negative.   Neurological: Negative for headaches.    Allergies  Januvia  Home Medications   Prior to Admission medications   Medication Sig Start Date End Date Taking? Authorizing Provider  aspirin 81 MG tablet Take 81 mg by mouth daily.    Historical Provider, MD  canagliflozin (INVOKANA) 100 MG TABS tablet Take 1 tablet (100 mg total) by mouth daily. Patient not taking: Reported on 08/09/2014 04/06/14   Marcial Pacas, DO  clotrimazole-betamethasone (LOTRISONE) cream APPLY TO AFFECTED AREA TWICE A DAY FOR TWO WEEKS, MAY REQUIRE FOUR WEEKS IF INVOLVING THE FEET/TOES.  08/09/14   Sean Hommel, DO  enalapril (VASOTEC) 20 MG tablet Take 1.5 tablets (30 mg total) by mouth daily. 07/18/14   Sean Hommel, DO  fenofibrate (TRICOR) 48 MG tablet TAKE 1 TABLET (48 MG TOTAL) BY MOUTH DAILY. 04/06/14   Marcial Pacas, DO  HYDROcodone-acetaminophen (NORCO/VICODIN) 5-325 MG per tablet Take 1-2 tablets by mouth every 6 (six) hours as needed for severe pain. Take with food. 08/03/14   Jacqulyn Cane, MD  ibuprofen (ADVIL,MOTRIN) 200 MG tablet Take three tablets ( 600 milligrams total) every 6 with food as needed for moderate pain. 08/03/14   Jacqulyn Cane, MD  lovastatin (MEVACOR) 40 MG tablet TAKE 2 TABLETS EVERY DAY 04/06/14   Marcial Pacas, DO  meloxicam (MOBIC) 7.5 MG tablet Take 1 tablet (7.5 mg total) by mouth daily. 07/18/14   Sean Hommel, DO  metFORMIN (GLUCOPHAGE) 1000 MG tablet One by mouth twice a day for blood sugar control. 08/09/14 08/09/15  Sean Hommel, DO  metoprolol (LOPRESSOR) 100 MG tablet TAKE 1 TABLET (100 MG TOTAL) BY MOUTH DAILY. 10/03/13   Sean Hommel, DO  mometasone (NASONEX) 50 MCG/ACT nasal spray Two sprays each nostril daily. To help prevent cough. 12/18/13   Sean Hommel, DO  pantoprazole (PROTONIX) 40 MG tablet Take 1 tablet (40 mg total) by mouth daily. 10/03/13   Marcial Pacas, DO  pioglitazone (ACTOS)  45 MG tablet TAKE 1 TABLET EVERY DAY 07/18/14   Sean Hommel, DO  predniSONE (DELTASONE) 20 MG tablet Take one tab twice daily with food for five days, then one daily for 3 days. 08/27/14   Kandra Nicolas, MD  ranolazine (RANEXA) 500 MG 12 hr tablet Take 1 tablet (500 mg total) by mouth 2 (two) times daily. 11/30/12   Sean Hommel, DO  valACYclovir (VALTREX) 1000 MG tablet Take 1 tablet (1,000 mg total) by mouth 3 (three) times daily. For 7 days. For shingles 08/03/14   Jacqulyn Cane, MD   BP 177/88 mmHg  Pulse 75  Temp(Src) 97.4 F (36.3 C) (Oral)  Resp 18  Ht 5\' 11"  (1.803 m)  Wt 160 lb (72.576 kg)  BMI 22.33 kg/m2  SpO2 99% Physical Exam  Constitutional: He is oriented to  person, place, and time. He appears well-developed and well-nourished. No distress.  HENT:  Head: Normocephalic.  Eyes: Pupils are equal, round, and reactive to light.  Cardiovascular: Normal heart sounds.   Pulmonary/Chest: Breath sounds normal.  Abdominal: There is no tenderness.  Musculoskeletal:       Left shoulder: He exhibits decreased range of motion, tenderness, bony tenderness and pain. He exhibits no swelling, no effusion and no crepitus.       Cervical back: He exhibits decreased range of motion, tenderness and bony tenderness. He exhibits no swelling.       Back:       Arms: Posterior neck has tenderness to palpation from left occipital area to left lateral area and left trapezius muscle.  Lymphadenopathy:    He has no cervical adenopathy.  Neurological: He is alert and oriented to person, place, and time.  Reflex Scores:      Bicep reflexes are 1+ on the right side and 1+ on the left side.      Patellar reflexes are 0 on the right side and 0 on the left side.      Achilles reflexes are 1+ on the right side and 1+ on the left side. Skin: Skin is warm and dry. No rash noted.  Nursing note and vitals reviewed.   ED Course  Procedures   none   Imaging Review Dg Cervical Spine Complete  08/27/2014   CLINICAL DATA:  One day history left neck pain radiating to left shoulder and scapula. No injury.  EXAM: CERVICAL SPINE  4+ VIEWS  COMPARISON:  12/16/2004  FINDINGS: Vertebral body alignment, heights and disc space heights are within normal. There is moderate spondylosis of the cervical spine. Prevertebral soft tissues are within normal. Atlantoaxial articulation is unremarkable. There is no acute fracture or subluxation. There is moderate uncovertebral joint spurring and facet arthropathy. The there is suboptimal positioning for accurate evaluation of the neural foramina. Remainder of the exam is within normal.  IMPRESSION: No acute fracture or subluxation.  Mild to moderate  spondylosis.   Electronically Signed   By: Marin Olp M.D.   On: 08/27/2014 17:46     MDM   1. Cervical radiculopathy    Dispensed soft cervical collar.  Begin prednisone 20mg  BID for 5 days, then one daily for 3 days. Apply ice pack for 15 minutes, 3 to 4 times daily.  Wear collar daytime. Followup with Dr. Aundria Mems ([facility]) in two weeks.    Kandra Nicolas, MD 08/27/14 [phone]

## 2014-08-28 ENCOUNTER — Encounter: Payer: Self-pay | Admitting: Family Medicine

## 2014-09-07 ENCOUNTER — Institutional Professional Consult (permissible substitution): Payer: Self-pay | Admitting: Sports Medicine

## 2014-09-10 ENCOUNTER — Ambulatory Visit (INDEPENDENT_AMBULATORY_CARE_PROVIDER_SITE_OTHER): Payer: Medicare Other | Admitting: Sports Medicine

## 2014-09-10 DIAGNOSIS — M4722 Other spondylosis with radiculopathy, cervical region: Secondary | ICD-10-CM | POA: Diagnosis not present

## 2014-09-10 MED ORDER — MELOXICAM 15 MG PO TABS: ORAL_TABLET | ORAL | Status: DC

## 2014-09-10 NOTE — Assessment & Plan Note (Signed)
Improved significantly with prednisone prescribed at urgent care. I am going to add meloxicam, he does have some left-sided C7 radiculopathy as well as likely facet mediated pain with extension. Formal physical therapy, at home. Return to see me in 4 weeks. MR for intervention if no better. Patient has fairly severe presbycusis.

## 2014-09-10 NOTE — Progress Notes (Signed)
   Subjective:    I'm seeing this patient as a consultation for:  Dr. Assunta Found  CC: neck pain  HPI: This is a pleasant 79 year old male who has great difficulty hearing, for years he's had pain that he localizes in the back of his neck, worse with extension, and occasional radiation down the left arm to the wrist but not to the hand. Denies any lower. Symptoms, pain is moderate, persistent.  Past medical history, Surgical history, Family history not pertinant except as noted below, Social history, Allergies, and medications have been entered into the medical record, reviewed, and no changes needed.   Review of Systems: No headache, visual changes, nausea, vomiting, diarrhea, constipation, dizziness, abdominal pain, skin rash, fevers, chills, night sweats, weight loss, swollen lymph nodes, body aches, joint swelling, muscle aches, chest pain, shortness of breath, mood changes, visual or auditory hallucinations.   Objective:   General: Well Developed, well nourished, and in no acute distress.  Neuro/Psych: Alert and oriented x3, extra-ocular muscles intact, able to move all 4 extremities, sensation grossly intact. Skin: Warm and dry, no rashes noted.  Respiratory: Not using accessory muscles, speaking in full sentences, trachea midline.  Cardiovascular: Pulses palpable, no extremity edema. Abdomen: Does not appear distended. Neck: Negative spurling's Neck is held in severe kyphosis, he has very limited extension. Grip strength and sensation normal in bilateral hands Strength good C4 to T1 distribution No sensory change to C4 to T1 Reflexes normal  X-ray show multilevel spondylosis with reversal of the normal cervical curvature/lordosis.  Impression and Recommendations:   This case required medical decision making of moderate complexity.

## 2014-10-08 ENCOUNTER — Encounter: Payer: Self-pay | Admitting: Sports Medicine

## 2014-10-08 ENCOUNTER — Ambulatory Visit: Payer: Medicare Other

## 2014-10-08 ENCOUNTER — Ambulatory Visit (INDEPENDENT_AMBULATORY_CARE_PROVIDER_SITE_OTHER): Payer: Medicare Other | Admitting: Sports Medicine

## 2014-10-08 VITALS — BP 168/92 | HR 92 | Ht 71.0 in | Wt 161.0 lb

## 2014-10-08 DIAGNOSIS — M1612 Unilateral primary osteoarthritis, left hip: Secondary | ICD-10-CM | POA: Diagnosis not present

## 2014-10-08 DIAGNOSIS — M25512 Pain in left shoulder: Secondary | ICD-10-CM | POA: Diagnosis not present

## 2014-10-08 DIAGNOSIS — M4722 Other spondylosis with radiculopathy, cervical region: Secondary | ICD-10-CM

## 2014-10-08 NOTE — Assessment & Plan Note (Addendum)
Has failed physician directed home rehabilitation. X-ray, MRI , return to go over MRI results.

## 2014-10-08 NOTE — Progress Notes (Signed)
  Subjective:    CC:  Follow-up  HPI: Left hip osteoarthritis: Painful, agrees to discuss this at a future visit.  Cervical radiculopathy: Has done some home rehabilitation exercises, unfortunately has persistent symptoms. Radiating down the arm, moderate.  Left shoulder pain: Localize over the deltoid, worse with overhead activities. Has done some home physician directed rehabilitation for greater than 6 weeks but no improvement.  Balance issues: Asked to discuss this with his PCP.  Past medical history, Surgical history, Family history not pertinant except as noted below, Social history, Allergies, and medications have been entered into the medical record, reviewed, and no changes needed.   Review of Systems: No fevers, chills, night sweats, weight loss, chest pain, or shortness of breath.   Objective:    General: Well Developed, well nourished, and in no acute distress.  Neuro: Alert and oriented x3, extra-ocular muscles intact, sensation grossly intact.  HEENT: Normocephalic, atraumatic, pupils equal round reactive to light, neck supple, no masses, no lymphadenopathy, thyroid nonpalpable.  Skin: Warm and dry, no rashes. Cardiac: Regular rate and rhythm, no murmurs rubs or gallops, no lower extremity edema.  Respiratory: Clear to auscultation bilaterally. Not using accessory muscles, speaking in full sentences. Left Shoulder: Inspection reveals no abnormalities, atrophy or asymmetry. Palpation is normal with no tenderness over AC joint or bicipital groove. ROM is full in all planes. Rotator cuff strength normal throughout. Positive Neer and Hawkin's tests, empty can. Speeds and Yergason's tests normal. No labral pathology noted with negative Obrien's, negative crank, negative clunk, and good stability. Normal scapular function observed. No painful arc and no drop arm sign. No apprehension sign  Impression and Recommendations:

## 2014-10-08 NOTE — Assessment & Plan Note (Addendum)
Persistent left-sided cervical radiculopathy, despite physician directed home rehabilitation. Return to see me for MRI results.

## 2014-10-08 NOTE — Assessment & Plan Note (Signed)
We can do an injection in the future if no better.

## 2014-10-15 ENCOUNTER — Ambulatory Visit (INDEPENDENT_AMBULATORY_CARE_PROVIDER_SITE_OTHER): Payer: Medicare Other

## 2014-10-15 DIAGNOSIS — M19012 Primary osteoarthritis, left shoulder: Secondary | ICD-10-CM | POA: Diagnosis not present

## 2014-10-15 DIAGNOSIS — M7522 Bicipital tendinitis, left shoulder: Secondary | ICD-10-CM | POA: Diagnosis not present

## 2014-10-15 DIAGNOSIS — M25512 Pain in left shoulder: Secondary | ICD-10-CM

## 2014-10-15 DIAGNOSIS — M4722 Other spondylosis with radiculopathy, cervical region: Secondary | ICD-10-CM | POA: Diagnosis not present

## 2014-10-15 DIAGNOSIS — M5031 Other cervical disc degeneration,  high cervical region: Secondary | ICD-10-CM | POA: Diagnosis not present

## 2014-10-15 DIAGNOSIS — M47812 Spondylosis without myelopathy or radiculopathy, cervical region: Secondary | ICD-10-CM | POA: Diagnosis not present

## 2014-10-15 DIAGNOSIS — M9971 Connective tissue and disc stenosis of intervertebral foramina of cervical region: Secondary | ICD-10-CM | POA: Diagnosis not present

## 2014-10-15 DIAGNOSIS — M5022 Other cervical disc displacement, mid-cervical region: Secondary | ICD-10-CM | POA: Diagnosis not present

## 2014-10-19 ENCOUNTER — Ambulatory Visit: Payer: Self-pay | Admitting: Family Medicine

## 2014-10-22 ENCOUNTER — Encounter: Payer: Self-pay | Admitting: Family Medicine

## 2014-10-22 ENCOUNTER — Ambulatory Visit (INDEPENDENT_AMBULATORY_CARE_PROVIDER_SITE_OTHER): Payer: Medicare Other | Admitting: Family Medicine

## 2014-10-22 VITALS — BP 145/88 | HR 107 | Wt 158.0 lb

## 2014-10-22 DIAGNOSIS — R519 Headache, unspecified: Secondary | ICD-10-CM

## 2014-10-22 DIAGNOSIS — R51 Headache: Secondary | ICD-10-CM

## 2014-10-22 DIAGNOSIS — E1121 Type 2 diabetes mellitus with diabetic nephropathy: Secondary | ICD-10-CM

## 2014-10-22 MED ORDER — GLIPIZIDE 5 MG PO TABS: 5.0000 mg | ORAL_TABLET | Freq: Two times a day (BID) | ORAL | Status: DC

## 2014-10-22 MED ORDER — DICLOFENAC SODIUM 2 % TD SOLN: TRANSDERMAL | Status: DC

## 2014-10-22 NOTE — Progress Notes (Signed)
CC: Alex Zavala is a 79 y.o. male is here for Diabetes   Subjective: HPI:  Follow-up type 2 diabetes: Continues to take metformin on a daily basis along with Actos. He felt that invokana was causing him to urinate too frequently so he stopped this sometime in the past 3 months and his symptoms of urinary frequency resolved. He denies polyuria polyphagia or polydipsia. He only wakes up once to go to the bathroom until the night. He denies any diarrhea or abdominal pain. No formal physical activity but he does try to watch his carbohydrate intake. Denies chest pain shortness of breath nor motor or sensory disturbances Other than that described below  Complains of soreness on his scalp localized in the left frontal and temporal region. Symptoms of present off and on for the past few weeks. Symptoms are mild in severity. No benefit from Lotrisone cream. No other interventions as of yet. Denies any injury or overlying skin changes. It's slightly tender to touch.denies nasal congestion or ocular complaints.     Review Of Systems Outlined In HPI  Past Medical History  Diagnosis Date  . Hypertension   . Heart disease   . Diabetes   . Hyperlipidemia     Past Surgical History  Procedure Laterality Date  . Cholecystectomy    . Retropubic prostatectomy     Family History  Problem Relation Age of Onset  . Hyperlipidemia    . Hypertension      History   Social History  . Marital Status: Divorced    Spouse Name: N/A  . Number of Children: N/A  . Years of Education: N/A   Occupational History  . Not on file.   Social History Main Topics  . Smoking status: Former Research scientist (life sciences)  . Smokeless tobacco: Not on file     Comment: quit smoking 50 years ago  . Alcohol Use: No  . Drug Use: No  . Sexual Activity: No   Other Topics Concern  . Not on file   Social History Narrative     Objective: BP 145/88 mmHg  Pulse 107  Wt 158 lb (71.668 kg)  General: Alert and Oriented, No Acute  Distress HEENT: Pupils equal, round, reactive to light. Conjunctivae clear.  Moist mucous membranes Lungs: clear comfortable work of breathing Cardiac: Regular rate and rhythm.    Extremities: No peripheral edema.  Strong peripheral pulses.  Mental Status: No depression, anxiety, nor agitation. Skin: Warm and dry. Scalp is unremarkable  Assessment & Plan: Alex Zavala was seen today for diabetes.  Diagnoses and all orders for this visit:  Type 2 diabetes mellitus with diabetic nephropathy Orders: -     POCT HgB A1C -     Ambulatory referral to Ophthalmology -     glipiZIDE (GLUCOTROL) 5 MG tablet; Take 1 tablet (5 mg total) by mouth 2 (two) times daily before a meal.  Scalp pain   Type 2 diabetes: A1c  8.9, uncontrolled, continue metformin and Actos starting low-dose glipizide. He is overdue for diabetic eye exam referral has been placed. Scalp pain: Suspect his pain is coming from some sort of muscular strain given that his exam is unremarkable. Start topical Pennsaid, sample was given, instructed to call if he wants a formal prescription.    Return in about 3 months (around 01/22/2015) for blood sugar.

## 2014-10-23 ENCOUNTER — Telehealth: Payer: Self-pay | Admitting: Family Medicine

## 2014-10-23 NOTE — Telephone Encounter (Signed)
Received fax for prior authorization on Pennsaid sent through cover my meds waiting on authorization. - CF

## 2014-10-26 ENCOUNTER — Other Ambulatory Visit: Payer: Self-pay | Admitting: Family Medicine

## 2014-10-26 DIAGNOSIS — K2970 Gastritis, unspecified, without bleeding: Secondary | ICD-10-CM

## 2014-10-26 MED ORDER — PANTOPRAZOLE SODIUM 40 MG PO TBEC: 40.0000 mg | DELAYED_RELEASE_TABLET | Freq: Every day | ORAL | Status: DC

## 2014-10-29 ENCOUNTER — Encounter: Payer: Self-pay | Admitting: Sports Medicine

## 2014-10-29 ENCOUNTER — Ambulatory Visit (INDEPENDENT_AMBULATORY_CARE_PROVIDER_SITE_OTHER): Payer: Medicare Other | Admitting: Sports Medicine

## 2014-10-29 VITALS — BP 177/90 | HR 67 | Wt 163.0 lb

## 2014-10-29 DIAGNOSIS — M25512 Pain in left shoulder: Secondary | ICD-10-CM | POA: Diagnosis not present

## 2014-10-29 NOTE — Assessment & Plan Note (Signed)
Rotator cuff tendinitis, subacromial bursitis and acromioclavicular osteoarthritis on MRI. Symptoms are predominantly impingement. Subacromial injection as above.

## 2014-10-29 NOTE — Progress Notes (Signed)
  Subjective:    CC: MRI follow-up  HPI: Then returns, he has left-sided shoulder pain, it was difficult to determine whether this represented radicular pain or impingement related pain, he had elements of his exam that mimic both. Today he tells me his pain is predominantly over the deltoid. Nothing radicular. Nothing past the elbow. Moderate, persistent.  Past medical history, Surgical history, Family history not pertinant except as noted below, Social history, Allergies, and medications have been entered into the medical record, reviewed, and no changes needed.   Review of Systems: No fevers, chills, night sweats, weight loss, chest pain, or shortness of breath.   Objective:    General: Well Developed, well nourished, and in no acute distress.  Neuro: Alert and oriented x3, extra-ocular muscles intact, sensation grossly intact.  HEENT: Normocephalic, atraumatic, pupils equal round reactive to light, neck supple, no masses, no lymphadenopathy, thyroid nonpalpable.  Skin: Warm and dry, no rashes. Cardiac: Regular rate and rhythm, no murmurs rubs or gallops, no lower extremity edema.  Respiratory: Clear to auscultation bilaterally. Not using accessory muscles, speaking in full sentences.  MRI reviewed, there is significant bilateral C3-C4 impingement, he also has significant acromioclavicular arthritis, and rotator cuff tendinosis.  Procedure: Real-time Ultrasound Guided Injection of left subacromial bursa Device: GE Logiq E  Verbal informed consent obtained.  Time-out conducted.  Noted no overlying erythema, induration, or other signs of local infection.  Skin prepped in a sterile fashion.  Local anesthesia: Topical Ethyl chloride.  With sterile technique and under real time ultrasound guidance:  1 mL Kenalog 40, 3 mL lidocaine injected easily Completed without difficulty  Pain immediately resolved suggesting accurate placement of the medication.  Advised to call if fevers/chills,  erythema, induration, drainage, or persistent bleeding.  Images permanently stored and available for review in the ultrasound unit.  Impression: Technically successful ultrasound guided injection.  Impression and Recommendations:

## 2014-11-26 ENCOUNTER — Encounter: Payer: Self-pay | Admitting: Sports Medicine

## 2014-11-26 ENCOUNTER — Ambulatory Visit (INDEPENDENT_AMBULATORY_CARE_PROVIDER_SITE_OTHER): Payer: Medicare Other | Admitting: Sports Medicine

## 2014-11-26 VITALS — BP 174/94 | HR 94 | Wt 159.0 lb

## 2014-11-26 DIAGNOSIS — M25512 Pain in left shoulder: Secondary | ICD-10-CM

## 2014-11-26 DIAGNOSIS — M1612 Unilateral primary osteoarthritis, left hip: Secondary | ICD-10-CM

## 2014-11-26 DIAGNOSIS — M545 Low back pain, unspecified: Secondary | ICD-10-CM

## 2014-11-26 NOTE — Assessment & Plan Note (Signed)
Improved after injection. 

## 2014-11-26 NOTE — Assessment & Plan Note (Signed)
Doing well. Pain is more referable to the left lumbar spine

## 2014-11-26 NOTE — Progress Notes (Signed)
  Subjective:    CC: Follow-up  HPI: Left shoulder pain: Overall doing well after subacromial injection  Left hip osteoarthritis: Pain-free  Left-sided back pain: Moderate, persistent without radiation. Worse with sitting, flexion, Valsalva, nothing radicular, no bowel or bladder dysfunction, no saddle numbness, he does have great difficulty leaving the house, he is limited by his orthopedic issues, and needs a cane. Physical therapy on an outpatient basis is also prohibitively expensive.  Past medical history, Surgical history, Family history not pertinant except as noted below, Social history, Allergies, and medications have been entered into the medical record, reviewed, and no changes needed.   Review of Systems: No fevers, chills, night sweats, weight loss, chest pain, or shortness of breath.   Objective:    General: Well Developed, well nourished, and in no acute distress.  Neuro: Alert and oriented x3, extra-ocular muscles intact, sensation grossly intact.  HEENT: Normocephalic, atraumatic, pupils equal round reactive to light, neck supple, no masses, no lymphadenopathy, thyroid nonpalpable.  Skin: Warm and dry, no rashes. Cardiac: Regular rate and rhythm, no murmurs rubs or gallops, no lower extremity edema.  Respiratory: Clear to auscultation bilaterally. Not using accessory muscles, speaking in full sentences. Left Hip: ROM IR: 60 Deg, ER: 60 Deg, Flexion: 120 Deg, Extension: 100 Deg, Abduction: 45 Deg, Adduction: 45 Deg Strength IR: 5/5, ER: 5/5, Flexion: 5/5, Extension: 5/5, Abduction: 5/5, Adduction: 5/5 Pelvic alignment unremarkable to inspection and palpation. Standing hip rotation and gait without trendelenburg / unsteadiness. Greater trochanter without tenderness to palpation. No tenderness over piriformis. No SI joint tenderness and normal minimal SI movement. Low back: Tender to palpation at the left sacroiliac joint.  Impression and Recommendations:

## 2014-11-26 NOTE — Assessment & Plan Note (Signed)
Axial low back pain, near the left sacroiliac joint. We are going to proceed conservatively with home health physical therapy, he does have great difficulty leaving the house, he is hard of hearing, and due to orthopedic process is limited in his motion. Return in one month, if no better we will proceed with MRI for interventional planning. I do want x-rays of his lumbar spine and SI joint.

## 2014-11-27 ENCOUNTER — Other Ambulatory Visit: Payer: Self-pay | Admitting: Family Medicine

## 2014-11-27 DIAGNOSIS — I10 Essential (primary) hypertension: Secondary | ICD-10-CM

## 2014-11-27 MED ORDER — METOPROLOL TARTRATE 100 MG PO TABS: ORAL_TABLET | ORAL | Status: DC

## 2014-11-28 ENCOUNTER — Telehealth: Payer: Self-pay | Admitting: Family Medicine

## 2014-11-28 DIAGNOSIS — I2510 Atherosclerotic heart disease of native coronary artery without angina pectoris: Secondary | ICD-10-CM

## 2014-11-28 MED ORDER — RANOLAZINE ER 500 MG PO TB12: 500.0000 mg | ORAL_TABLET | Freq: Two times a day (BID) | ORAL | Status: DC

## 2014-11-28 NOTE — Telephone Encounter (Signed)
Refill req 

## 2014-12-10 ENCOUNTER — Encounter: Payer: Self-pay | Admitting: Family Medicine

## 2014-12-10 ENCOUNTER — Ambulatory Visit (INDEPENDENT_AMBULATORY_CARE_PROVIDER_SITE_OTHER): Payer: Medicare Other | Admitting: Family Medicine

## 2014-12-10 VITALS — BP 161/100 | HR 93 | Wt 154.0 lb

## 2014-12-10 DIAGNOSIS — H547 Unspecified visual loss: Secondary | ICD-10-CM

## 2014-12-10 DIAGNOSIS — R21 Rash and other nonspecific skin eruption: Secondary | ICD-10-CM | POA: Diagnosis not present

## 2014-12-10 MED ORDER — VALACYCLOVIR HCL 1 G PO TABS: 1000.0000 mg | ORAL_TABLET | Freq: Three times a day (TID) | ORAL | Status: DC

## 2014-12-10 NOTE — Progress Notes (Signed)
CC: Alex Zavala is a 79 y.o. male is here for rash on the back of both legs   Subjective: HPI:  Itchy rash localized on both legs that came on one week ago suddenly. There were some red spots where the age was. He started putting clotrimazole/betamethasone cream on it twice a day and after a few days it drastically improved. He's been doing this for 1 week now. He points to 2 little red spots on the distal left thigh and states that there were many of these on both legs when the rash erupted. He denies skin changes elsewhere. Denies fevers, chills, flushing, swollen lymph nodes. States he feels overall pretty good right now.  He would like referral to a new ophthalmologist    Review Of Systems Outlined In HPI  Past Medical History  Diagnosis Date  . Hypertension   . Heart disease   . Diabetes   . Hyperlipidemia     Past Surgical History  Procedure Laterality Date  . Cholecystectomy    . Retropubic prostatectomy     Family History  Problem Relation Age of Onset  . Hyperlipidemia    . Hypertension      Social History   Social History  . Marital Status: Divorced    Spouse Name: N/A  . Number of Children: N/A  . Years of Education: N/A   Occupational History  . Not on file.   Social History Main Topics  . Smoking status: Former Research scientist (life sciences)  . Smokeless tobacco: Not on file     Comment: quit smoking 50 years ago  . Alcohol Use: No  . Drug Use: No  . Sexual Activity: No   Other Topics Concern  . Not on file   Social History Narrative     Objective: BP 161/100 mmHg  Pulse 93  Wt 154 lb (69.854 kg)  Vital signs reviewed. General: Alert and Oriented, No Acute Distress HEENT: Pupils equal, round, reactive to light. Conjunctivae clear.  External ears unremarkable.  Moist mucous membranes. Lungs: Clear and comfortable work of breathing, speaking in full sentences without accessory muscle use. Cardiac: Regular rate and rhythm.  Neuro: CN II-XII grossly intact, gait  normal. Extremities: No peripheral edema.  Strong peripheral pulses.  Mental Status: No depression, anxiety, nor agitation. Logical though process. Skin: Warm and dry. 2 papules on the distal left thigh without signs of infection no other abnormalities.  Assessment & Plan: Alex Zavala was seen today for rash on the back of both legs.  Diagnoses and all orders for this visit:  Rash and nonspecific skin eruption -     valACYclovir (VALTREX) 1000 MG tablet; Take 1 tablet (1,000 mg total) by mouth 3 (three) times daily. For another week.  Vision loss -     Ambulatory referral to Ophthalmology    reassurance provided that his symptoms sound like they were simply representing a allergic he is fearful but the rash represents shingles. Reassurance provided that since the rash is not painless and it involved both sides of his body is very unlikely to be shingles. He was provided with Valtrex to use if the rash starts spreading, is unilateral and painful.  Return if symptoms worsen or fail to improve.

## 2014-12-24 ENCOUNTER — Encounter: Payer: Self-pay | Admitting: Sports Medicine

## 2014-12-24 ENCOUNTER — Ambulatory Visit (INDEPENDENT_AMBULATORY_CARE_PROVIDER_SITE_OTHER): Payer: Medicare Other | Admitting: Sports Medicine

## 2014-12-24 VITALS — BP 154/81 | HR 60 | Wt 158.0 lb

## 2014-12-24 DIAGNOSIS — M25512 Pain in left shoulder: Secondary | ICD-10-CM | POA: Diagnosis not present

## 2014-12-24 DIAGNOSIS — M4722 Other spondylosis with radiculopathy, cervical region: Secondary | ICD-10-CM

## 2014-12-24 NOTE — Assessment & Plan Note (Signed)
Left-sided cervical injection has improved all impingement related symptoms.

## 2014-12-24 NOTE — Assessment & Plan Note (Signed)
Again, persistent left sided cervical radiculopathy. MRI does show a couple of levels of cervical spinal stenosis. Unfortunately his hearing deficit has made it difficult to schedule home health physical therapy. We will try to get this set up for him today.

## 2014-12-24 NOTE — Progress Notes (Signed)
  Subjective:    CC:  Follow-up  HPI: Left shoulder pain: This is due to a couple of processes, both subacromial bursitis and left upper cervical radiculopathy, we injected his subacromial bursa a couple of months ago, and he continues to be pain free with regards to impingement type symptoms, unfortunately continues to have neck pain with left shoulder radiculopathy.  We obtained an MRI that showed cervical spinal stenosis a couple of levels, and recommended formal physical therapy at home however he has severe hearing loss, and has not been able to communicate with the home health physical therapist over the phone over the past 2 months to set up an initial appointment.  Past medical history, Surgical history, Family history not pertinant except as noted below, Social history, Allergies, and medications have been entered into the medical record, reviewed, and no changes needed.   Review of Systems: No fevers, chills, night sweats, weight loss, chest pain, or shortness of breath.   Objective:    General: Well Developed, well nourished, and in no acute distress.  Neuro: Alert and oriented x3, extra-ocular muscles intact, sensation grossly intact.  HEENT: Normocephalic, atraumatic, pupils equal round reactive to light, neck supple, no masses, no lymphadenopathy, thyroid nonpalpable.  Skin: Warm and dry, no rashes. Cardiac: Regular rate and rhythm, no murmurs rubs or gallops, no lower extremity edema.  Respiratory: Clear to auscultation bilaterally. Not using accessory muscles, speaking in full sentences.  Impression and Recommendations:    I spent 25 minutes with this patient, greater than 50% was face-to-face time counseling regarding the above diagnoses

## 2014-12-27 ENCOUNTER — Telehealth: Payer: Self-pay | Admitting: Family Medicine

## 2014-12-27 DIAGNOSIS — M4722 Other spondylosis with radiculopathy, cervical region: Secondary | ICD-10-CM

## 2014-12-27 NOTE — Telephone Encounter (Signed)
Patient does not qualify for Home Health PT because he takes care of himself and drives everywhere himself. I will cancel the homehealth PT order and If you want, you can put in a Physical Therapy order for our Physical Therapy here in our building? Thanks, [employer]

## 2014-12-27 NOTE — Telephone Encounter (Signed)
Orders placed for standard physical therapy.

## 2015-01-18 DIAGNOSIS — H524 Presbyopia: Secondary | ICD-10-CM | POA: Diagnosis not present

## 2015-01-18 LAB — HM DIABETES EYE EXAM

## 2015-01-22 ENCOUNTER — Encounter: Payer: Self-pay | Admitting: Family Medicine

## 2015-01-22 ENCOUNTER — Ambulatory Visit (INDEPENDENT_AMBULATORY_CARE_PROVIDER_SITE_OTHER): Payer: Medicare Other | Admitting: Family Medicine

## 2015-01-22 VITALS — BP 179/96 | HR 72 | Wt 158.0 lb

## 2015-01-22 DIAGNOSIS — Z23 Encounter for immunization: Secondary | ICD-10-CM

## 2015-01-22 DIAGNOSIS — R51 Headache: Secondary | ICD-10-CM | POA: Diagnosis not present

## 2015-01-22 DIAGNOSIS — E1121 Type 2 diabetes mellitus with diabetic nephropathy: Secondary | ICD-10-CM

## 2015-01-22 DIAGNOSIS — I10 Essential (primary) hypertension: Secondary | ICD-10-CM | POA: Diagnosis not present

## 2015-01-22 DIAGNOSIS — R519 Headache, unspecified: Secondary | ICD-10-CM

## 2015-01-22 MED ORDER — LIDOCAINE 5 % EX OINT: 1.0000 "application " | TOPICAL_OINTMENT | CUTANEOUS | Status: DC | PRN

## 2015-01-22 MED ORDER — ENALAPRIL MALEATE 20 MG PO TABS: 40.0000 mg | ORAL_TABLET | Freq: Every day | ORAL | Status: DC

## 2015-01-22 MED ORDER — AMBULATORY NON FORMULARY MEDICATION: Status: AC

## 2015-01-22 NOTE — Patient Instructions (Signed)
To help reduce mucus in the back of your throat begin taking a 10 mg tablet of Zyrtec, this can be found at most pharmacies over-the-counter.

## 2015-01-22 NOTE — Progress Notes (Signed)
CC: Alex Zavala is a 79 y.o. male is here for Diabetes   Subjective: HPI:  Follow-up type 2 diabetes: Continues to take metformin, glipizide and Actos. No outside blood sugars reported. Denies polyuria polyphagia or polydipsia. Denies poorly healing wounds.  Follow-up essential hypertension: Outside blood pressures in our sports medicine clinic have been above 140/90. No other outside blood pressures to report. He reports 100% compliance with enalapril, metoprolol. Denies chest pain shortness of breath orthopnea nor peripheral edema  Complains of scalp pain localized in the left frontal region. Only occurs a couple days out of the week. Nothing makes it better or worse. He's tried topical NSAIDs and topical steroids without much benefit    Review Of Systems Outlined In HPI  Past Medical History  Diagnosis Date  . Hypertension   . Heart disease   . Diabetes ([place])   . Hyperlipidemia     Past Surgical History  Procedure Laterality Date  . Cholecystectomy    . Retropubic prostatectomy     Family History  Problem Relation Age of Onset  . Hyperlipidemia    . Hypertension      Social History   Social History  . Marital Status: Divorced    Spouse Name: N/A  . Number of Children: N/A  . Years of Education: N/A   Occupational History  . Not on file.   Social History Main Topics  . Smoking status: Former Research scientist (life sciences)  . Smokeless tobacco: Not on file     Comment: quit smoking 50 years ago  . Alcohol Use: No  . Drug Use: No  . Sexual Activity: No   Other Topics Concern  . Not on file   Social History Narrative     Objective: BP 179/96 mmHg  Pulse 72  Wt 158 lb (71.668 kg)  General: Alert and Oriented, No Acute Distress HEENT: Pupils equal, round, reactive to light. Conjunctivae clear.  Moist mucous membranes pharynx unremarkable Lungs: Clear to auscultation bilaterally, no wheezing/ronchi/rales.  Comfortable work of breathing. Good air movement. Cardiac: Regular rate  and rhythm. Normal S1/S2.  No murmurs, rubs, nor gallops.   Extremities: No peripheral edema.  Strong peripheral pulses.  Mental Status: No depression, anxiety, nor agitation. Skin: Warm and dry.unremarkable appearance of skin at his location of pain on the scout  Assessment & Plan: Alex Zavala was seen today for diabetes.  Diagnoses and all orders for this visit:  Encounter for immunization  Type 2 diabetes mellitus with diabetic nephropathy, without long-term current use of insulin ([place])  Essential hypertension, benign  Scalp pain  Other orders -     AMBULATORY NON FORMULARY MEDICATION; Glucometer per insurance formulary.  Glucometer test strips, use to check blood sugar daily.  Dx: Type 2 Diabetes -     lidocaine (XYLOCAINE) 5 % ointment; Apply 1 application topically as needed. -     enalapril (VASOTEC) 20 MG tablet; Take 2 tablets (40 mg total) by mouth daily. -     Flu Vaccine QUAD 36+ mos IM   Type 2 diabetes: A1c is 7.5 considered normal in this elderly patient changes to his current antihyperglycemic medications. Essential hypertension: Uncontrolled chronic condition increasing enalapril. Scalp pain: I'll see any clear source for his pain but I would like him to try lidocaine in hopes of reducing the pain when present.  Return in about 3 months (around 04/24/2015) for Blood Pressure and Sugar.

## 2015-01-28 ENCOUNTER — Ambulatory Visit (INDEPENDENT_AMBULATORY_CARE_PROVIDER_SITE_OTHER): Payer: Medicare Other | Admitting: Family Medicine

## 2015-01-28 ENCOUNTER — Ambulatory Visit (INDEPENDENT_AMBULATORY_CARE_PROVIDER_SITE_OTHER): Payer: Medicare Other

## 2015-01-28 ENCOUNTER — Encounter: Payer: Self-pay | Admitting: Family Medicine

## 2015-01-28 VITALS — BP 159/84 | HR 77 | Wt 157.0 lb

## 2015-01-28 DIAGNOSIS — R0789 Other chest pain: Secondary | ICD-10-CM

## 2015-01-28 DIAGNOSIS — W19XXXA Unspecified fall, initial encounter: Secondary | ICD-10-CM

## 2015-01-28 DIAGNOSIS — S2231XA Fracture of one rib, right side, initial encounter for closed fracture: Secondary | ICD-10-CM

## 2015-01-28 MED ORDER — ACETAMINOPHEN-CODEINE #3 300-30 MG PO TABS: 1.0000 | ORAL_TABLET | ORAL | Status: DC | PRN

## 2015-01-28 NOTE — Progress Notes (Signed)
CC: Alex Zavala is a 79 y.o. male is here for Fall   Subjective: HPI:  On Tuesday of last week patient fell into a chair. He braced his fall with his right elbow and his right thorax. He had immediate pain and the pain in the elbow has resolved however he still has right-sided chest wall pain. Pain is worse when touching the ribs on the right side of the chest. Worse when coughing. Nothing else seems to make better or worse. He tells me the bruising looks like it's getting better and there's been no new bruising or bleeding. Denies any motor or sensory disturbances. Denies shortness of breath or productive cough. Denies any pain with breathing.  This is the second fall in the last 2 months. He tells me he lost his balance but is not sure why.    Review Of Systems Outlined In HPI  Past Medical History  Diagnosis Date  . Hypertension   . Heart disease   . Diabetes ([place])   . Hyperlipidemia     Past Surgical History  Procedure Laterality Date  . Cholecystectomy    . Retropubic prostatectomy     Family History  Problem Relation Age of Onset  . Hyperlipidemia    . Hypertension      Social History   Social History  . Marital Status: Divorced    Spouse Name: N/A  . Number of Children: N/A  . Years of Education: N/A   Occupational History  . Not on file.   Social History Main Topics  . Smoking status: Former Research scientist (life sciences)  . Smokeless tobacco: Not on file     Comment: quit smoking 50 years ago  . Alcohol Use: No  . Drug Use: No  . Sexual Activity: No   Other Topics Concern  . Not on file   Social History Narrative     Objective: BP 159/84 mmHg  Pulse 77  Wt 157 lb (71.215 kg)  General: Alert and Oriented, No Acute Distress HEENT: Pupils equal, round, reactive to light. Conjunctivae clear.  Moist membranes Lungs: Clear to auscultation bilaterally, no wheezing/ronchi/rales.  Comfortable work of breathing. Good air movement. Cardiac: Regular rate and rhythm. Pain is  reproduced with palpation of the costal cartilage junction of ribs 7 through 10  Extremities: No peripheral edema.  Strong peripheral pulses.  Mental Status: No depression, anxiety, nor agitation. Skin: Warm and dry.  Assessment & Plan: Pleasant was seen today for fall.  Diagnoses and all orders for this visit:  Chest wall pain -     DG Ribs Unilateral W/Chest Right; Future  Falls, initial encounter -     Ambulatory referral to [place]  Rib fracture, right, closed, initial encounter -     acetaminophen-codeine (TYLENOL #3) 300-30 MG tablet; Take 1-2 tablets by mouth every 4 (four) hours as needed for moderate pain.   Chest wall pain: X-ray was obtained to inquire into the possibility of a fracture. He actually does have a fracture in the ninth rib on the right side. Discussed that the main treatment for this is rest, deep breathing exercises and pain control. Will attempt to keep him comfortable with Tylenol No. 3, home health referral to minimize risk of future falls.  25 minutes spent face-to-face during visit today of which at least 50% was counseling or coordinating care regarding: 1. Chest wall pain   2. Falls, initial encounter   3. Rib fracture, right, closed, initial encounter      Return  if symptoms worsen or fail to improve.

## 2015-02-02 DIAGNOSIS — I2510 Atherosclerotic heart disease of native coronary artery without angina pectoris: Secondary | ICD-10-CM | POA: Diagnosis not present

## 2015-02-02 DIAGNOSIS — E119 Type 2 diabetes mellitus without complications: Secondary | ICD-10-CM | POA: Diagnosis not present

## 2015-02-02 DIAGNOSIS — H905 Unspecified sensorineural hearing loss: Secondary | ICD-10-CM | POA: Diagnosis not present

## 2015-02-02 DIAGNOSIS — R296 Repeated falls: Secondary | ICD-10-CM | POA: Diagnosis not present

## 2015-02-02 DIAGNOSIS — I10 Essential (primary) hypertension: Secondary | ICD-10-CM | POA: Diagnosis not present

## 2015-02-07 ENCOUNTER — Encounter: Payer: Self-pay | Admitting: Family Medicine

## 2015-02-12 ENCOUNTER — Telehealth: Payer: Self-pay

## 2015-02-12 DIAGNOSIS — E119 Type 2 diabetes mellitus without complications: Secondary | ICD-10-CM | POA: Diagnosis not present

## 2015-02-12 DIAGNOSIS — H905 Unspecified sensorineural hearing loss: Secondary | ICD-10-CM | POA: Diagnosis not present

## 2015-02-12 DIAGNOSIS — I2510 Atherosclerotic heart disease of native coronary artery without angina pectoris: Secondary | ICD-10-CM | POA: Diagnosis not present

## 2015-02-12 DIAGNOSIS — R296 Repeated falls: Secondary | ICD-10-CM | POA: Diagnosis not present

## 2015-02-12 DIAGNOSIS — I10 Essential (primary) hypertension: Secondary | ICD-10-CM | POA: Diagnosis not present

## 2015-02-12 NOTE — Telephone Encounter (Signed)
Pt care giver called reporting that pt BP was 150/110.  Pt denies any SOB, chest pain, dizziness, and headache.  He stated to her that he didn't take BP meds because he haven't eaten anything today.  Care giver also reports that he is acting normal and haven't noticed anything out of the ordinary.

## 2015-02-12 NOTE — Telephone Encounter (Signed)
FYI Home Health/PT called and reports patient's blood pressure at 150/110. He has not taken his blood pressure pills today. I advised them to have him take his blood pressure pills. He said he will later when he eats.

## 2015-02-13 ENCOUNTER — Other Ambulatory Visit: Payer: Self-pay | Admitting: Family Medicine

## 2015-02-26 DIAGNOSIS — E119 Type 2 diabetes mellitus without complications: Secondary | ICD-10-CM | POA: Diagnosis not present

## 2015-02-26 DIAGNOSIS — R296 Repeated falls: Secondary | ICD-10-CM | POA: Diagnosis not present

## 2015-02-26 DIAGNOSIS — I10 Essential (primary) hypertension: Secondary | ICD-10-CM | POA: Diagnosis not present

## 2015-02-26 DIAGNOSIS — H905 Unspecified sensorineural hearing loss: Secondary | ICD-10-CM | POA: Diagnosis not present

## 2015-02-26 DIAGNOSIS — I2510 Atherosclerotic heart disease of native coronary artery without angina pectoris: Secondary | ICD-10-CM | POA: Diagnosis not present

## 2015-02-27 ENCOUNTER — Ambulatory Visit (INDEPENDENT_AMBULATORY_CARE_PROVIDER_SITE_OTHER): Payer: Medicare Other | Admitting: Family Medicine

## 2015-02-27 ENCOUNTER — Encounter: Payer: Self-pay | Admitting: Family Medicine

## 2015-02-27 VITALS — BP 175/82 | HR 63 | Wt 152.0 lb

## 2015-02-27 DIAGNOSIS — R519 Headache, unspecified: Secondary | ICD-10-CM

## 2015-02-27 DIAGNOSIS — I10 Essential (primary) hypertension: Secondary | ICD-10-CM | POA: Diagnosis not present

## 2015-02-27 DIAGNOSIS — R51 Headache: Secondary | ICD-10-CM | POA: Diagnosis not present

## 2015-02-27 MED ORDER — HYDROCHLOROTHIAZIDE 25 MG PO TABS: ORAL_TABLET | ORAL | Status: DC

## 2015-02-27 NOTE — Progress Notes (Signed)
CC: Alex Zavala is a 79 y.o. male is here for Hypertension   Subjective: HPI:  Follow the central hypertension: His home health team has been checking his blood pressure at home and blood pressures are ranging between stage I and stage II hypertension. Continues to take enalapril and metoprolol on a daily basis. No chest pain shortness of breath orthopnea or peripheral edema.  Follow-up scalp pain: He denies any skin changes on his scalp. He is happy to announce that using Lotrisone on the scalp for a few days completely resolved his pain.   Review Of Systems Outlined In HPI  Past Medical History  Diagnosis Date  . Hypertension   . Heart disease   . Diabetes ([place])   . Hyperlipidemia     Past Surgical History  Procedure Laterality Date  . Cholecystectomy    . Retropubic prostatectomy     Family History  Problem Relation Age of Onset  . Hyperlipidemia    . Hypertension      Social History   Social History  . Marital Status: Divorced    Spouse Name: N/A  . Number of Children: N/A  . Years of Education: N/A   Occupational History  . Not on file.   Social History Main Topics  . Smoking status: Former Research scientist (life sciences)  . Smokeless tobacco: Not on file     Comment: quit smoking 50 years ago  . Alcohol Use: No  . Drug Use: No  . Sexual Activity: No   Other Topics Concern  . Not on file   Social History Narrative     Objective: BP 175/82 mmHg  Pulse 63  Wt 152 lb (68.947 kg)  General: Alert and Oriented, No Acute Distress HEENT: Pupils equal, round, reactive to light. Conjunctivae clear.  Moist mucousmembranes Lungs: Clear to auscultation bilaterally, no wheezing/ronchi/rales.  Comfortable work of breathing. Good air movement. Cardiac: Regular rate and rhythm. Normal S1/S2.  No murmurs, rubs, nor gallops.   Extremities: No peripheral edema.  Strong peripheral pulses.  Mental Status: No depression, anxiety, nor agitation. Skin: Warm and dry. Unremarkable  scalp  Assessment & Plan: Alex Zavala was seen today for hypertension.  Diagnoses and all orders for this visit:  Essential hypertension, benign  Scalp pain  Other orders -     hydrochlorothiazide (HYDRODIURIL) 25 MG tablet; One tablet by mouth every morning for blood pressure control.   Essential hypertension: Uncontrolled chronic condition starting hydrochlorothiazide in addition to his other 2 antihypertensives. Scalp pain: Resolved continue as needed use of Lotrisone.   Return in about 3 months (around 05/28/2015) for bp.

## 2015-03-08 DIAGNOSIS — I10 Essential (primary) hypertension: Secondary | ICD-10-CM | POA: Diagnosis not present

## 2015-03-08 DIAGNOSIS — I2510 Atherosclerotic heart disease of native coronary artery without angina pectoris: Secondary | ICD-10-CM | POA: Diagnosis not present

## 2015-03-08 DIAGNOSIS — R296 Repeated falls: Secondary | ICD-10-CM | POA: Diagnosis not present

## 2015-03-08 DIAGNOSIS — E119 Type 2 diabetes mellitus without complications: Secondary | ICD-10-CM | POA: Diagnosis not present

## 2015-03-08 DIAGNOSIS — H905 Unspecified sensorineural hearing loss: Secondary | ICD-10-CM | POA: Diagnosis not present

## 2015-04-02 DIAGNOSIS — R296 Repeated falls: Secondary | ICD-10-CM | POA: Diagnosis not present

## 2015-04-02 DIAGNOSIS — H905 Unspecified sensorineural hearing loss: Secondary | ICD-10-CM | POA: Diagnosis not present

## 2015-04-02 DIAGNOSIS — E119 Type 2 diabetes mellitus without complications: Secondary | ICD-10-CM | POA: Diagnosis not present

## 2015-04-02 DIAGNOSIS — I10 Essential (primary) hypertension: Secondary | ICD-10-CM | POA: Diagnosis not present

## 2015-04-02 DIAGNOSIS — I2510 Atherosclerotic heart disease of native coronary artery without angina pectoris: Secondary | ICD-10-CM | POA: Diagnosis not present

## 2015-04-22 ENCOUNTER — Ambulatory Visit (INDEPENDENT_AMBULATORY_CARE_PROVIDER_SITE_OTHER): Payer: Medicare Other | Admitting: Family Medicine

## 2015-04-22 ENCOUNTER — Encounter: Payer: Self-pay | Admitting: Family Medicine

## 2015-04-22 VITALS — BP 155/78 | HR 58 | Wt 153.0 lb

## 2015-04-22 DIAGNOSIS — M4722 Other spondylosis with radiculopathy, cervical region: Secondary | ICD-10-CM | POA: Diagnosis not present

## 2015-04-22 DIAGNOSIS — Z85038 Personal history of other malignant neoplasm of large intestine: Secondary | ICD-10-CM

## 2015-04-22 DIAGNOSIS — S2231XA Fracture of one rib, right side, initial encounter for closed fracture: Secondary | ICD-10-CM | POA: Diagnosis not present

## 2015-04-22 DIAGNOSIS — I10 Essential (primary) hypertension: Secondary | ICD-10-CM | POA: Diagnosis not present

## 2015-04-22 DIAGNOSIS — E1121 Type 2 diabetes mellitus with diabetic nephropathy: Secondary | ICD-10-CM

## 2015-04-22 LAB — POCT GLYCOSYLATED HEMOGLOBIN (HGB A1C): HEMOGLOBIN A1C: 6.4

## 2015-04-22 MED ORDER — ACETAMINOPHEN-CODEINE #3 300-30 MG PO TABS: 1.0000 | ORAL_TABLET | ORAL | Status: DC | PRN

## 2015-04-22 MED ORDER — HYDROCHLOROTHIAZIDE 25 MG PO TABS: ORAL_TABLET | ORAL | Status: DC

## 2015-04-22 MED ORDER — GLIPIZIDE 5 MG PO TABS: 5.0000 mg | ORAL_TABLET | Freq: Two times a day (BID) | ORAL | Status: DC

## 2015-04-22 MED ORDER — IPRATROPIUM BROMIDE 0.03 % NA SOLN: 2.0000 | Freq: Two times a day (BID) | NASAL | Status: DC

## 2015-04-22 MED ORDER — MELOXICAM 15 MG PO TABS: ORAL_TABLET | ORAL | Status: DC

## 2015-04-22 MED ORDER — METOPROLOL TARTRATE 100 MG PO TABS: ORAL_TABLET | ORAL | Status: DC

## 2015-04-22 NOTE — Progress Notes (Signed)
CC: Alex Zavala is a 80 y.o. male is here for Hyperglycemia   Subjective: HPI:  Type 2 diabetes: Continues to take glipizide, metformin andActos. No outside Blood sugars to report. Denies  shortness of breath orthopnea nor peripheral edema.denies polyuria point visual polydipsia.  He's requesting a refill on Tylenol No. 3. He tells me that his right-sided chest pain has drastically improved since I saw him last however there are some days where he'll be some soreness and Tylenol No. 3 seems to help.  He like a refill of meloxicam to help with his chronic was to her neck pain. He denies any new radicular symptoms or any motor or sensory disturbances in his upper extremities.  Follow-up Essential hypertension: Continues to take enalapril, metoprolol and hctz.. Denies chest pain other than that described above. Denies shortness of breath   His major complaint today is nasal congestion that's been present on a daily basis for the last 3 weeks.It's worse when eating. He denies fevers, chills, facial pressure or cough   Review Of Systems Outlined In HPI  Past Medical History  Diagnosis Date  . Hypertension   . Heart disease   . Diabetes ([place])   . Hyperlipidemia     Past Surgical History  Procedure Laterality Date  . Cholecystectomy    . Retropubic prostatectomy     Family History  Problem Relation Age of Onset  . Hyperlipidemia    . Hypertension      Social History   Social History  . Marital Status: Divorced    Spouse Name: N/A  . Number of Children: N/A  . Years of Education: N/A   Occupational History  . Not on file.   Social History Main Topics  . Smoking status: Former Research scientist (life sciences)  . Smokeless tobacco: Not on file     Comment: quit smoking 50 years ago  . Alcohol Use: No  . Drug Use: No  . Sexual Activity: No   Other Topics Concern  . Not on file   Social History Narrative     Objective: BP 155/78 mmHg  Pulse 58  Wt 153 lb (69.4 kg)  General: Alert and  Oriented, No Acute Distress HEENT: Pupils equal, round, reactive to light. Conjunctivae clear.  External ears unremarkable, canals clear with intact TMs with appropriate landmarks.  Middle ear appears open without effusion. Pink inferior turbinates.  Moist mucous membranes, pharynx without inflammation nor lesions.  Neck supple without palpable lymphadenopathy nor abnormal masses. Lungs: Clear to auscultation bilaterally, no wheezing/ronchi/rales.  Comfortable work of breathing. Good air movement. Cardiac: Regular rate and rhythm. Normal S1/S2.  No murmurs, rubs, nor gallops.   Extremities: No peripheral edema.  Strong peripheral pulses.  Mental Status: No depression, anxiety, nor agitation. Skin: Warm and dry.  Assessment & Plan: Alex Zavala was seen today for hyperglycemia.  Diagnoses and all orders for this visit:  Type 2 diabetes mellitus with diabetic nephropathy, without long-term current use of insulin (HCC)  Essential hypertension, benign  Rib fracture, right, closed, initial encounter -     acetaminophen-codeine (TYLENOL #3) 300-30 MG tablet; Take 1-2 tablets by mouth every 4 (four) hours as needed for moderate pain.  Type 2 diabetes mellitus with diabetic nephropathy, unspecified long term insulin use status (HCC) -     glipiZIDE (GLUCOTROL) 5 MG tablet; Take 1 tablet (5 mg total) by mouth 2 (two) times daily before a meal.  Cervical spondylosis with radiculopathy -     meloxicam (MOBIC) 15 MG tablet; One  tab PO qAM with breakfast for 2 weeks, then daily prn pain.  History of colon cancer, stage II -     Ambulatory referral to Gastroenterology  Other orders -     hydrochlorothiazide (HYDRODIURIL) 25 MG tablet; One tablet by mouth every morning for blood pressure control. -     ipratropium (ATROVENT) 0.03 % nasal spray; Place 2 sprays into both nostrils every 12 (twelve) hours.   Type 2 diabetes: A1c of 6.8, control continue metformin, glipizide and Actos. Essential hypertension:  Uncontrolled, he is only taking metoprolol once a day changing to twice a day dosing continue current dose of enalapril and HCTZ Posterior neck pain controlled with Modic Nasal congestion: Start ipratropium  Due for surveillance for colon cancer. Referral will be placed.   Return in about 3 months (around 07/21/2015).

## 2015-04-22 NOTE — Addendum Note (Signed)
Addended by: Delrae Alfred on: 04/22/2015 02:54 PM   Modules accepted: Orders

## 2015-05-06 ENCOUNTER — Other Ambulatory Visit: Payer: Self-pay | Admitting: Family Medicine

## 2015-06-24 ENCOUNTER — Ambulatory Visit (INDEPENDENT_AMBULATORY_CARE_PROVIDER_SITE_OTHER): Payer: Medicare Other | Admitting: Family Medicine

## 2015-06-24 ENCOUNTER — Encounter: Payer: Self-pay | Admitting: Family Medicine

## 2015-06-24 VITALS — BP 180/89 | HR 71 | Wt 146.0 lb

## 2015-06-24 DIAGNOSIS — J329 Chronic sinusitis, unspecified: Secondary | ICD-10-CM | POA: Diagnosis not present

## 2015-06-24 DIAGNOSIS — A499 Bacterial infection, unspecified: Secondary | ICD-10-CM

## 2015-06-24 DIAGNOSIS — Z85038 Personal history of other malignant neoplasm of large intestine: Secondary | ICD-10-CM | POA: Diagnosis not present

## 2015-06-24 DIAGNOSIS — B9689 Other specified bacterial agents as the cause of diseases classified elsewhere: Secondary | ICD-10-CM

## 2015-06-24 MED ORDER — AMOXICILLIN-POT CLAVULANATE 500-125 MG PO TABS
ORAL_TABLET | ORAL | Status: AC
Start: 2015-06-24 — End: 2015-07-04

## 2015-06-24 NOTE — Progress Notes (Signed)
CC: Alex Zavala is a 80 y.o. male is here for Abdominal Pain and sinus pressure   Subjective: HPI:  2-3 weeks of facial pressure in the forehead and cheeks, postnasal drip and nonproductive cough. Symptoms have not improved with over-the-counter cough and cold medications. No other interventions as of yet. Symptoms are moderate in severity and persistent throughout the day and night. Nothing seems to make it better or worse. He gets some tenderness while coughing underneath the anterior inferior ribs but no other pain. Denies fevers, chills, wheezing or shortness of breath.   Review Of Systems Outlined In HPI  Past Medical History  Diagnosis Date  . Hypertension   . Heart disease   . Diabetes ([place])   . Hyperlipidemia     Past Surgical History  Procedure Laterality Date  . Cholecystectomy    . Retropubic prostatectomy     Family History  Problem Relation Age of Onset  . Hyperlipidemia    . Hypertension      Social History   Social History  . Marital Status: Divorced    Spouse Name: N/A  . Number of Children: N/A  . Years of Education: N/A   Occupational History  . Not on file.   Social History Main Topics  . Smoking status: Former Research scientist (life sciences)  . Smokeless tobacco: Not on file     Comment: quit smoking 50 years ago  . Alcohol Use: No  . Drug Use: No  . Sexual Activity: No   Other Topics Concern  . Not on file   Social History Narrative     Objective: BP 180/89 mmHg  Pulse 71  Wt 146 lb (66.225 kg)  General: Alert and Oriented, No Acute Distress HEENT: Pupils equal, round, reactive to light. Conjunctivae clear.  External ears unremarkable, canals clear with intact TMs with appropriate landmarks.  Middle ear appears open without effusion. Pink inferior turbinates.  Moist mucous membranes, pharynx without inflammation nor lesions.  Neck supple without palpable lymphadenopathy nor abnormal masses. Lungs: Clear to auscultation bilaterally, no wheezing/ronchi/rales.   Comfortable work of breathing. Good air movement. Cardiac: Regular rate and rhythm. Normal S1/S2.  No murmurs, rubs, nor gallops.   Extremities: No peripheral edema.  Strong peripheral pulses.  Mental Status: No depression, anxiety, nor agitation. Skin: Warm and dry.  Assessment & Plan: Damani was seen today for abdominal pain and sinus pressure.  Diagnoses and all orders for this visit:  History of colon cancer, stage II -     Ambulatory referral to Gastroenterology  Bacterial sinusitis  Other orders -     amoxicillin-clavulanate (AUGMENTIN) 500-125 MG tablet; Take one by mouth every 8 hours for ten total days.   Bacterialsinusitis: Start Augmentin and nasal saline washes. He is overdue for a colonoscopy, he has a history of colon cancer, he would like to have something [place] [facility] [place]  Return if symptoms worsen or fail to improve.

## 2015-07-15 ENCOUNTER — Ambulatory Visit (INDEPENDENT_AMBULATORY_CARE_PROVIDER_SITE_OTHER): Payer: Medicare Other | Admitting: Family Medicine

## 2015-07-15 ENCOUNTER — Encounter: Payer: Self-pay | Admitting: Family Medicine

## 2015-07-15 VITALS — BP 160/79 | HR 65 | Wt 151.0 lb

## 2015-07-15 DIAGNOSIS — I10 Essential (primary) hypertension: Secondary | ICD-10-CM | POA: Diagnosis not present

## 2015-07-15 DIAGNOSIS — M4722 Other spondylosis with radiculopathy, cervical region: Secondary | ICD-10-CM

## 2015-07-15 DIAGNOSIS — E1121 Type 2 diabetes mellitus with diabetic nephropathy: Secondary | ICD-10-CM | POA: Diagnosis not present

## 2015-07-15 LAB — POCT GLYCOSYLATED HEMOGLOBIN (HGB A1C): HEMOGLOBIN A1C: 6.6

## 2015-07-15 MED ORDER — TRAMADOL HCL 50 MG PO TABS: 50.0000 mg | ORAL_TABLET | Freq: Three times a day (TID) | ORAL | Status: DC | PRN

## 2015-07-15 MED ORDER — METOPROLOL TARTRATE 100 MG PO TABS: ORAL_TABLET | ORAL | Status: DC

## 2015-07-15 MED ORDER — MELOXICAM 15 MG PO TABS: ORAL_TABLET | ORAL | Status: DC

## 2015-07-15 NOTE — Addendum Note (Signed)
Addended by: Delrae Alfred on: 07/15/2015 01:45 PM   Modules accepted: Orders

## 2015-07-15 NOTE — Progress Notes (Addendum)
CC: Alex Zavala is a 80 y.o. male is here for Hyperglycemia and Medication Refill   Subjective: HPI:  Cervical radiculopathy: He is requesting a refill on meloxicam he tells me this gives some mild degree of pain relief with respect to his posterior left shoulder discomfort that seems to be radiating from the back of the neck. Symptoms are worse at night when trying to fall sleep. Nothing seems to make the symptoms better or worse other than that described above. He tells me that pain and discomfort is interfering with his quality of life and he wants her something different meloxicam he can try. He denies any motor or sensory disturbances in the extremities.  Follow-up hypertension: Taking metoprolol and enalapril without percent compliance. No outside blood pressures report. Denies chest pain shortness of breath orthopnea nor peripheral edema.  Follow-up type 2 diabetes: He continues to take meloxicam, glipizide and Actos on a daily basis. No outside blood sugars to report. Denies vision loss nor polyuria or polydipsia.     Review Of Systems Outlined In HPI  Past Medical History  Diagnosis Date  . Hypertension   . Heart disease   . Diabetes ([place])   . Hyperlipidemia     Past Surgical History  Procedure Laterality Date  . Cholecystectomy    . Retropubic prostatectomy     Family History  Problem Relation Age of Onset  . Hyperlipidemia    . Hypertension      Social History   Social History  . Marital Status: Divorced    Spouse Name: N/A  . Number of Children: N/A  . Years of Education: N/A   Occupational History  . Not on file.   Social History Main Topics  . Smoking status: Former Research scientist (life sciences)  . Smokeless tobacco: Not on file     Comment: quit smoking 50 years ago  . Alcohol Use: No  . Drug Use: No  . Sexual Activity: No   Other Topics Concern  . Not on file   Social History Narrative     Objective: BP 160/79 mmHg  Pulse 65  Wt 151 lb (68.493 kg)  General:  Alert and Oriented, No Acute Distress HEENT: Pupils equal, round, reactive to light. Conjunctivae clear.   moist mucous membranes  Lungs: Clear to auscultation bilaterally, no wheezing/ronchi/rales.  Comfortable work of breathing. Good air movement. Cardiac: Regular rate and rhythm. Normal S1/S2.  No murmurs, rubs, nor gallops.   Extremities: No peripheral edema.  Strong peripheral pulses.  Mental Status: No depression, anxiety, nor agitation. Skin: Warm and dry.  Assessment & Plan: Alex Zavala was seen today for hyperglycemia and medication refill.  Diagnoses and all orders for this visit:  Cervical spondylosis with radiculopathy -     meloxicam (MOBIC) 15 MG tablet; One by mouth daily with a meal as needed for pain.  Essential hypertension, benign -     metoprolol (LOPRESSOR) 100 MG tablet; TAKE 1.5 TABLETs (150 MG TOTAL) BY MOUTH TWICE A DAY  Type 2 diabetes with nephropathy (HCC)  Other orders -     traMADol (ULTRAM) 50 MG tablet; Take 1 tablet (50 mg total) by mouth every 8 (eight) hours as needed (back pain).   Cervical radiculopathy: Pain control has not been optimized before starting tramadol to take on on an as-needed basis at night to help with sleep and relieve pain  type 2 diabetes: A1c is 6.6, controlled with glipizide, metformin, Actos   essential hypertension: Uncontrolled chronic condition therefore increasing dose of  metoprolol continue on full dose of enalapril.    Return in about 3 months (around 10/14/2015) for sugar.

## 2015-07-22 ENCOUNTER — Ambulatory Visit: Payer: Self-pay | Admitting: Family Medicine

## 2015-07-23 ENCOUNTER — Other Ambulatory Visit: Payer: Self-pay

## 2015-07-23 MED ORDER — METFORMIN HCL 1000 MG PO TABS: ORAL_TABLET | ORAL | Status: AC

## 2015-07-23 MED ORDER — ENALAPRIL MALEATE 20 MG PO TABS: ORAL_TABLET | ORAL | Status: DC

## 2015-07-31 ENCOUNTER — Other Ambulatory Visit: Payer: Self-pay | Admitting: Family Medicine

## 2015-08-23 ENCOUNTER — Telehealth: Payer: Self-pay | Admitting: Family Medicine

## 2015-08-23 NOTE — Telephone Encounter (Signed)
Left detailed message for patient on answering machine to call [facility] to schedule an appointment.  Also to call this office if he has any questions.

## 2015-08-23 NOTE — Telephone Encounter (Signed)
Pt called clinic today to see if it was time for his next colonoscopy. Will route to PCP.

## 2015-08-23 NOTE — Telephone Encounter (Signed)
Rick Duff, PA-C saw him in March to prepare for a colonoscopy but I don't see that it was ever performed. I would recommend he call [facility] [phone] to schedule this in the near future.  He is overdue for a colonoscopy.

## 2015-09-02 ENCOUNTER — Encounter: Payer: Self-pay | Admitting: Family Medicine

## 2015-09-02 ENCOUNTER — Ambulatory Visit (INDEPENDENT_AMBULATORY_CARE_PROVIDER_SITE_OTHER): Payer: Medicare Other | Admitting: Family Medicine

## 2015-09-02 VITALS — BP 184/94 | HR 84 | Wt 146.0 lb

## 2015-09-02 DIAGNOSIS — Z85038 Personal history of other malignant neoplasm of large intestine: Secondary | ICD-10-CM | POA: Diagnosis not present

## 2015-09-02 DIAGNOSIS — M25512 Pain in left shoulder: Secondary | ICD-10-CM | POA: Diagnosis not present

## 2015-09-02 DIAGNOSIS — I2510 Atherosclerotic heart disease of native coronary artery without angina pectoris: Secondary | ICD-10-CM

## 2015-09-02 MED ORDER — PREDNISONE 20 MG PO TABS: ORAL_TABLET | ORAL | Status: AC

## 2015-09-02 MED ORDER — NITROGLYCERIN 0.4 MG SL SUBL: 0.4000 mg | SUBLINGUAL_TABLET | SUBLINGUAL | Status: AC | PRN

## 2015-09-02 NOTE — Progress Notes (Signed)
CC: Alex Zavala is a 80 y.o. male is here for Shoulder Pain and Hypertension   Subjective: HPI:  Left shoulder pain present for the past 2 weeks. It's worse with any sudden movements or when he tries to sleep on the left shoulder. It's localized deep in the shoulder and radiates into the left shoulder blade. No interventions as of yet. He denies any exertional component to his pain. He's had some increased sputum production but denies any wheezing shortness of breath or chest pain elsewhere. He denies any motor or sensory disturbances other than a shooting sensation down the left arm when his shoulder "catches" and this will go away immediately if he stops moving his arm. He denies any weakness in the upper extremities. He denies any recent accident.  He will like to have a refill of nitroglycerin that expired couple months ago. He does not have any exertional chest pain or new shortness of breath.  He found out that he has to pay somewhere around $700 for his colonoscopy. This is caused him to have reservations about getting it done.   Review Of Systems Outlined In HPI  Past Medical History  Diagnosis Date  . Hypertension   . Heart disease   . Diabetes ([place])   . Hyperlipidemia     Past Surgical History  Procedure Laterality Date  . Cholecystectomy    . Retropubic prostatectomy     Family History  Problem Relation Age of Onset  . Hyperlipidemia    . Hypertension      Social History   Social History  . Marital Status: Divorced    Spouse Name: N/A  . Number of Children: N/A  . Years of Education: N/A   Occupational History  . Not on file.   Social History Main Topics  . Smoking status: Former Research scientist (life sciences)  . Smokeless tobacco: Not on file     Comment: quit smoking 50 years ago  . Alcohol Use: No  . Drug Use: No  . Sexual Activity: No   Other Topics Concern  . Not on file   Social History Narrative     Objective: BP 184/94 mmHg  Pulse 84  Wt 146 lb (66.225  kg)  General: Alert and Oriented, No Acute Distress HEENT: Pupils equal, round, reactive to light. Conjunctivae clear.  Moist mucous membranes pharynx unremarkable. Lungs: Clear to auscultation bilaterally, no wheezing/ronchi/rales.  Comfortable work of breathing. Good air movement. Cardiac: Regular rate and rhythm. Normal S1/S2.  No murmurs, rubs, nor gallops.   Left shoulder exam reveals full range of motion and strength in all planes of motion and with individual rotator cuff testing. No overlying redness warmth or swelling.  Neer's test negative.  Hawkins test negative. Empty can negative. Crossarm test positive. O'Brien's test negative. Apprehension test negative. Speed's test negative. Extremities: No peripheral edema.  Strong peripheral pulses.  Mental Status: No depression, anxiety, nor agitation. Skin: Warm and dry.  Assessment & Plan: [place] was seen today for shoulder pain and hypertension.  Diagnoses and all orders for this visit:  Left shoulder pain -     predniSONE (DELTASONE) 20 MG tablet; Three tabs daily days 1-3, two tabs daily days 4-6, one tab daily days 7-9, half tab daily days 10-13.  History of colon cancer, stage II  Atherosclerosis of native coronary artery of native heart without angina pectoris  Other orders -     nitroGLYCERIN (NITROSTAT) 0.4 MG SL tablet; Place 1 tablet (0.4 mg total) under the tongue  every 5 (five) minutes as needed for chest pain.   Left shoulder pain: Suspect some component of acromioclavicular joint osteoarthritis with confusing me is that he also has a radicular component down the arm that sounds more like a pinched nerve. Trial of prednisone taper if no better by next week will arrange [facility] PT and referral to Dr. Darene Lamer.  25 minutes spent face-to-face during visit today of which at least 50% was counseling or coordinating care regarding: 1. Left shoulder pain   2. History of colon cancer, stage II   3. Atherosclerosis of native coronary artery  of native heart without angina pectoris       Return if symptoms worsen or fail to improve.

## 2015-10-07 ENCOUNTER — Ambulatory Visit (INDEPENDENT_AMBULATORY_CARE_PROVIDER_SITE_OTHER): Payer: Medicare Other

## 2015-10-07 ENCOUNTER — Ambulatory Visit (INDEPENDENT_AMBULATORY_CARE_PROVIDER_SITE_OTHER): Payer: Medicare Other | Admitting: Family Medicine

## 2015-10-07 ENCOUNTER — Encounter: Payer: Self-pay | Admitting: Family Medicine

## 2015-10-07 VITALS — BP 176/90 | HR 83 | Wt 147.0 lb

## 2015-10-07 DIAGNOSIS — R059 Cough, unspecified: Secondary | ICD-10-CM

## 2015-10-07 DIAGNOSIS — R05 Cough: Secondary | ICD-10-CM

## 2015-10-07 DIAGNOSIS — R351 Nocturia: Secondary | ICD-10-CM

## 2015-10-07 DIAGNOSIS — I700 Atherosclerosis of aorta: Secondary | ICD-10-CM

## 2015-10-07 DIAGNOSIS — E1121 Type 2 diabetes mellitus with diabetic nephropathy: Secondary | ICD-10-CM

## 2015-10-07 DIAGNOSIS — I10 Essential (primary) hypertension: Secondary | ICD-10-CM

## 2015-10-07 DIAGNOSIS — S76309A Unspecified injury of muscle, fascia and tendon of the posterior muscle group at thigh level, unspecified thigh, initial encounter: Secondary | ICD-10-CM | POA: Diagnosis not present

## 2015-10-07 LAB — POCT GLYCOSYLATED HEMOGLOBIN (HGB A1C): Hemoglobin A1C: 6.3

## 2015-10-07 MED ORDER — METOPROLOL TARTRATE 100 MG PO TABS: ORAL_TABLET | ORAL | Status: AC

## 2015-10-07 MED ORDER — AZITHROMYCIN 250 MG PO TABS: ORAL_TABLET | ORAL | Status: AC

## 2015-10-07 NOTE — Addendum Note (Signed)
Addended by: Marcial Pacas on: 10/07/2015 12:50 PM   Modules accepted: Orders

## 2015-10-07 NOTE — Progress Notes (Signed)
CC: Alex Zavala is a 80 y.o. male is here for Hyperglycemia and Hypertension   Subjective: HPI:  Follow-up essential hypertension: Taking his medications on percent compliance. No outside blood pressures report. Denies chest pain shortness of breath or peripheral edema.  Follow-up type 2 diabetes: Taking all of his antihyperglycemic's with 100% compliance. Denies polyphagia or polydipsia but has had some polyuria especially at night. No outside blood sugars to report. Denies vision disturbance  Complaints today of excessive urination. He feels that he has to urinate every few hours despite trying to limit his fluid intake. He is unsure about incomplete voiding. He denies any dysuria or any other genitourinary complaints. Symptoms are mild in severity during the daytime but moderate in severity at night.  He complains of a cough that's been bothering him since Thursday. He tells me been producing excessive amount of sputum but no blood in the sputum. It's worse first thing in the morning and when lying down trying to go to bed. He denies any shortness of breath or wheezing. He denies chest pain. No interventions as of yet  He also complains of some hamstring tightness is present when walking but never encountered when at rest. It's been going on for a few weeks now on a daily basis mild in severity. No interventions as of yet but he wants some guidance on how to help with this. He denies any eye pain otherwise. He denies any hip or knee pain   Review Of Systems Outlined In HPI  Past Medical History  Diagnosis Date  . Hypertension   . Heart disease   . Diabetes ([place])   . Hyperlipidemia     Past Surgical History  Procedure Laterality Date  . Cholecystectomy    . Retropubic prostatectomy     Family History  Problem Relation Age of Onset  . Hyperlipidemia    . Hypertension      Social History   Social History  . Marital Status: Divorced    Spouse Name: N/A  . Number of Children:  N/A  . Years of Education: N/A   Occupational History  . Not on file.   Social History Main Topics  . Smoking status: Former Research scientist (life sciences)  . Smokeless tobacco: Not on file     Comment: quit smoking 50 years ago  . Alcohol Use: No  . Drug Use: No  . Sexual Activity: No   Other Topics Concern  . Not on file   Social History Narrative     Objective: BP 176/90 mmHg  Pulse 83  Wt 147 lb (66.679 kg)  General: Alert and Oriented, No Acute Distress HEENT: Pupils equal, round, reactive to light. Conjunctivae clear.  Moist mucous membranes pharynx unremarkable Lungs: Clear to auscultation bilaterally, no wheezing/ronchi/rales.  Comfortable work of breathing. Good air movement. Cardiac: Regular rate and rhythm. Normal S1/S2.  No murmurs, rubs, nor gallops.   Extremities: No peripheral edema.  Strong peripheral pulses.  Mental Status: No depression, anxiety, nor agitation. Skin: Warm and dry.  Assessment & Plan: Veron was seen today for hyperglycemia and hypertension.  Diagnoses and all orders for this visit:  Essential hypertension, benign -     metoprolol (LOPRESSOR) 100 MG tablet; TAKE 2 TABLETs (200 MG TOTAL) BY MOUTH TWICE A DAY  Type 2 diabetes with nephropathy (HCC) -     POCT HgB A1C  Cough -     DG Chest 2 View  Hamstring injury, unspecified laterality, initial encounter  Excessive urination at night -  Ambulatory referral to Urology    essential hypertension: Uncontrolled chronic condition increasing metoprolol continue all other antihypertensives   type 2 diabetes: A1c today at goal, continue all antihyperglycemic's Cough: Check an x-ray given his remote history of blood in sputum Hamstring tightness: We went over exercises at home and a home rehabilitation plan to engage in for the next 2 weeks. He would like a referral for his increased urination which seems reasonable  I've also asked him to schedule a appointment with Dr. Darene Lamer for evaluation/referral of his left  neck and shoulder pain    Return in about 3 months (around 01/07/2016) for 1-2 week visit with Dr. Darene Lamer for left shoulder pain.

## 2015-10-14 ENCOUNTER — Ambulatory Visit: Payer: Self-pay | Admitting: Family Medicine

## 2015-10-23 ENCOUNTER — Telehealth: Payer: Self-pay | Admitting: Family Medicine

## 2015-10-23 ENCOUNTER — Ambulatory Visit (INDEPENDENT_AMBULATORY_CARE_PROVIDER_SITE_OTHER): Payer: Medicare Other | Admitting: Family Medicine

## 2015-10-23 ENCOUNTER — Encounter: Payer: Self-pay | Admitting: Family Medicine

## 2015-10-23 VITALS — BP 134/75 | HR 73 | Wt 158.0 lb

## 2015-10-23 DIAGNOSIS — M25512 Pain in left shoulder: Secondary | ICD-10-CM

## 2015-10-23 DIAGNOSIS — E1121 Type 2 diabetes mellitus with diabetic nephropathy: Secondary | ICD-10-CM

## 2015-10-23 DIAGNOSIS — M4722 Other spondylosis with radiculopathy, cervical region: Secondary | ICD-10-CM | POA: Diagnosis not present

## 2015-10-23 MED ORDER — LOVASTATIN 40 MG PO TABS: 80.0000 mg | ORAL_TABLET | Freq: Every day | ORAL | 5 refills | Status: AC

## 2015-10-23 MED ORDER — CLOTRIMAZOLE-BETAMETHASONE 1-0.05 % EX CREA: TOPICAL_CREAM | CUTANEOUS | 6 refills | Status: AC

## 2015-10-23 MED ORDER — MELOXICAM 15 MG PO TABS: ORAL_TABLET | ORAL | 3 refills | Status: DC

## 2015-10-23 MED ORDER — TRAMADOL HCL 50 MG PO TABS: 50.0000 mg | ORAL_TABLET | Freq: Three times a day (TID) | ORAL | 2 refills | Status: DC | PRN

## 2015-10-23 MED ORDER — GLIPIZIDE 5 MG PO TABS: 5.0000 mg | ORAL_TABLET | Freq: Two times a day (BID) | ORAL | 3 refills | Status: DC

## 2015-10-23 NOTE — Progress Notes (Signed)
CC: Alex Zavala is a 80 y.o. male is here for Shoulder Pain and Back Pain   Subjective: HPI:  Yesterday his left shoulder pain returned. He's been dealing with this off and on for the past year now. The episode yesterday was described as a sharp pain that was localized in the back of the shoulder that went up to the back of his neck and also slightly down beyond his left scapula. Nothing particular made it better or worse. It was mild in severity. He denies any motor or sensory disturbances nor radiation into the arm. He was able to fall sleep without having to take any medicine for pain. He woke up this morning without any pain whatsoever. He denies any recent or remote trauma or overexertion. He denies joint pain elsewhere. He would like a refill on tramadol and meloxicam to use in the future if the pain returns.  He would also like a refill on Lotrisone that he uses on his scalp whenever he gets dermatitis. He has no skin complaints to come plan of today.  Is requesting a refill on glipizide with no outside but sugars to report.   Review Of Systems Outlined In HPI  Past Medical History:  Diagnosis Date  . Diabetes ([place])   . Heart disease   . Hyperlipidemia   . Hypertension     Past Surgical History:  Procedure Laterality Date  . CHOLECYSTECTOMY    . RETROPUBIC PROSTATECTOMY     Family History  Problem Relation Age of Onset  . Hyperlipidemia    . Hypertension      Social History   Social History  . Marital status: Divorced    Spouse name: N/A  . Number of children: N/A  . Years of education: N/A   Occupational History  . Not on file.   Social History Main Topics  . Smoking status: Former Research scientist (life sciences)  . Smokeless tobacco: Not on file     Comment: quit smoking 50 years ago  . Alcohol use No  . Drug use: No  . Sexual activity: No   Other Topics Concern  . Not on file   Social History Narrative  . No narrative on file     Objective: BP 134/75   Pulse 73   Wt  158 lb (71.7 kg)   BMI 22.04 kg/m   Vital signs reviewed. General: Alert and Oriented, No Acute Distress HEENT: Pupils equal, round, reactive to light. Conjunctivae clear.  External ears unremarkable.  Moist mucous membranes. Lungs: Clear and comfortable work of breathing, speaking in full sentences without accessory muscle use. Cardiac: Regular rate and rhythm.  Neuro: CN II-XII grossly intact, gait normal. Extremities: No peripheral edema.  Strong peripheral pulses.  Left shoulder exam reveals full range of motion and strength in all planes of motion and with individual rotator cuff testing. No overlying redness warmth or swelling.  Neer's test negative.  Hawkins test negative. Empty can negative. Crossarm test negative. O'Brien's test negative. Apprehension test negative. Speed's test negative. Mental Status: No depression, anxiety, nor agitation. Logical though process. Skin: Warm and dry.  Assessment & Plan: Huu was seen today for shoulder pain and back pain.  Diagnoses and all orders for this visit:  Left shoulder pain -     Ambulatory referral to [facility]  Type 2 diabetes with nephropathy ([place])  Cervical spondylosis with radiculopathy -     meloxicam (MOBIC) 15 MG tablet; One by mouth daily with a meal as needed for  pain.  Type 2 diabetes mellitus with diabetic nephropathy, unspecified long term insulin use status (HCC) -     glipiZIDE (GLUCOTROL) 5 MG tablet; Take 1 tablet (5 mg total) by mouth 2 (two) times daily before a meal.  Other orders -     traMADol (ULTRAM) 50 MG tablet; Take 1 tablet (50 mg total) by mouth every 8 (eight) hours as needed (back pain). -     lovastatin (MEVACOR) 40 MG tablet; Take 2 tablets (80 mg total) by mouth daily. -     clotrimazole-betamethasone (LOTRISONE) cream; APPLY TO AFFECTED AREA TWICE A DAY FOR TWO WEEKS, MAY REQUIRE FOUR WEEKS IF INVOLVING THE FEET/TOES.   Left shoulder pain: He is open to the idea of home health to provide  physical therapy to help prevent the return of his pain. I suspect it's coming from his known rotator cuff pathology. Again I recommended that he see Dr. Darene Lamer if pain persist after 1-2 weeks. Can use tramadol and meloxicam for pain control. Type 2 diabetes: Refill on glipizide, not yet due for A1c 25 minutes spent face-to-face during visit today of which at least 50% was counseling or coordinating care regarding: 1. Left shoulder pain   2. Type 2 diabetes with nephropathy (HCC)   3. Cervical spondylosis with radiculopathy   4. Type 2 diabetes mellitus with diabetic nephropathy, unspecified long term insulin use status ([place])      Return in about 4 weeks (around 11/20/2015) for Dr. Darene Lamer Re-Evaluation After Starting Physical Therapy.

## 2015-10-23 NOTE — Telephone Encounter (Signed)
Patient had an appt today and walked back in advising that his weight was wrong it stated 158 and he got back on the old scale and it stated 149 and he request to have that changed on his chart please. Thanks

## 2015-10-28 DIAGNOSIS — Z85038 Personal history of other malignant neoplasm of large intestine: Secondary | ICD-10-CM | POA: Diagnosis not present

## 2015-10-28 DIAGNOSIS — E785 Hyperlipidemia, unspecified: Secondary | ICD-10-CM | POA: Diagnosis not present

## 2015-10-28 DIAGNOSIS — M25512 Pain in left shoulder: Secondary | ICD-10-CM | POA: Diagnosis not present

## 2015-10-28 DIAGNOSIS — Z7984 Long term (current) use of oral hypoglycemic drugs: Secondary | ICD-10-CM | POA: Diagnosis not present

## 2015-10-28 DIAGNOSIS — M4722 Other spondylosis with radiculopathy, cervical region: Secondary | ICD-10-CM | POA: Diagnosis not present

## 2015-10-28 DIAGNOSIS — I2510 Atherosclerotic heart disease of native coronary artery without angina pectoris: Secondary | ICD-10-CM | POA: Diagnosis not present

## 2015-10-28 DIAGNOSIS — M6281 Muscle weakness (generalized): Secondary | ICD-10-CM | POA: Diagnosis not present

## 2015-10-28 DIAGNOSIS — M1612 Unilateral primary osteoarthritis, left hip: Secondary | ICD-10-CM | POA: Diagnosis not present

## 2015-10-28 DIAGNOSIS — M545 Low back pain: Secondary | ICD-10-CM | POA: Diagnosis not present

## 2015-10-28 DIAGNOSIS — K219 Gastro-esophageal reflux disease without esophagitis: Secondary | ICD-10-CM | POA: Diagnosis not present

## 2015-10-28 DIAGNOSIS — E1140 Type 2 diabetes mellitus with diabetic neuropathy, unspecified: Secondary | ICD-10-CM | POA: Diagnosis not present

## 2015-10-28 DIAGNOSIS — I10 Essential (primary) hypertension: Secondary | ICD-10-CM | POA: Diagnosis not present

## 2015-11-01 DIAGNOSIS — Z85038 Personal history of other malignant neoplasm of large intestine: Secondary | ICD-10-CM | POA: Diagnosis not present

## 2015-11-01 DIAGNOSIS — I10 Essential (primary) hypertension: Secondary | ICD-10-CM | POA: Diagnosis not present

## 2015-11-01 DIAGNOSIS — M6281 Muscle weakness (generalized): Secondary | ICD-10-CM | POA: Diagnosis not present

## 2015-11-01 DIAGNOSIS — Z7984 Long term (current) use of oral hypoglycemic drugs: Secondary | ICD-10-CM | POA: Diagnosis not present

## 2015-11-01 DIAGNOSIS — M545 Low back pain: Secondary | ICD-10-CM | POA: Diagnosis not present

## 2015-11-01 DIAGNOSIS — M25512 Pain in left shoulder: Secondary | ICD-10-CM | POA: Diagnosis not present

## 2015-11-01 DIAGNOSIS — K219 Gastro-esophageal reflux disease without esophagitis: Secondary | ICD-10-CM | POA: Diagnosis not present

## 2015-11-01 DIAGNOSIS — M1612 Unilateral primary osteoarthritis, left hip: Secondary | ICD-10-CM | POA: Diagnosis not present

## 2015-11-01 DIAGNOSIS — E1140 Type 2 diabetes mellitus with diabetic neuropathy, unspecified: Secondary | ICD-10-CM | POA: Diagnosis not present

## 2015-11-01 DIAGNOSIS — M4722 Other spondylosis with radiculopathy, cervical region: Secondary | ICD-10-CM | POA: Diagnosis not present

## 2015-11-01 DIAGNOSIS — I2510 Atherosclerotic heart disease of native coronary artery without angina pectoris: Secondary | ICD-10-CM | POA: Diagnosis not present

## 2015-11-01 DIAGNOSIS — E785 Hyperlipidemia, unspecified: Secondary | ICD-10-CM | POA: Diagnosis not present

## 2015-11-04 DIAGNOSIS — Z85038 Personal history of other malignant neoplasm of large intestine: Secondary | ICD-10-CM | POA: Diagnosis not present

## 2015-11-04 DIAGNOSIS — Z7984 Long term (current) use of oral hypoglycemic drugs: Secondary | ICD-10-CM | POA: Diagnosis not present

## 2015-11-04 DIAGNOSIS — M545 Low back pain: Secondary | ICD-10-CM | POA: Diagnosis not present

## 2015-11-04 DIAGNOSIS — E785 Hyperlipidemia, unspecified: Secondary | ICD-10-CM | POA: Diagnosis not present

## 2015-11-04 DIAGNOSIS — E1140 Type 2 diabetes mellitus with diabetic neuropathy, unspecified: Secondary | ICD-10-CM | POA: Diagnosis not present

## 2015-11-04 DIAGNOSIS — M25512 Pain in left shoulder: Secondary | ICD-10-CM | POA: Diagnosis not present

## 2015-11-04 DIAGNOSIS — I2510 Atherosclerotic heart disease of native coronary artery without angina pectoris: Secondary | ICD-10-CM | POA: Diagnosis not present

## 2015-11-04 DIAGNOSIS — I10 Essential (primary) hypertension: Secondary | ICD-10-CM | POA: Diagnosis not present

## 2015-11-04 DIAGNOSIS — M1612 Unilateral primary osteoarthritis, left hip: Secondary | ICD-10-CM | POA: Diagnosis not present

## 2015-11-04 DIAGNOSIS — K219 Gastro-esophageal reflux disease without esophagitis: Secondary | ICD-10-CM | POA: Diagnosis not present

## 2015-11-04 DIAGNOSIS — M6281 Muscle weakness (generalized): Secondary | ICD-10-CM | POA: Diagnosis not present

## 2015-11-04 DIAGNOSIS — M4722 Other spondylosis with radiculopathy, cervical region: Secondary | ICD-10-CM | POA: Diagnosis not present

## 2015-11-05 ENCOUNTER — Encounter: Payer: Self-pay | Admitting: Family Medicine

## 2015-11-05 ENCOUNTER — Ambulatory Visit (INDEPENDENT_AMBULATORY_CARE_PROVIDER_SITE_OTHER): Payer: Medicare Other

## 2015-11-05 ENCOUNTER — Ambulatory Visit (INDEPENDENT_AMBULATORY_CARE_PROVIDER_SITE_OTHER): Payer: Medicare Other | Admitting: Family Medicine

## 2015-11-05 DIAGNOSIS — S0101XA Laceration without foreign body of scalp, initial encounter: Secondary | ICD-10-CM | POA: Diagnosis not present

## 2015-11-05 DIAGNOSIS — G319 Degenerative disease of nervous system, unspecified: Secondary | ICD-10-CM | POA: Diagnosis not present

## 2015-11-05 DIAGNOSIS — S0191XA Laceration without foreign body of unspecified part of head, initial encounter: Secondary | ICD-10-CM

## 2015-11-05 DIAGNOSIS — Z23 Encounter for immunization: Secondary | ICD-10-CM | POA: Diagnosis not present

## 2015-11-05 DIAGNOSIS — W01198A Fall on same level from slipping, tripping and stumbling with subsequent striking against other object, initial encounter: Secondary | ICD-10-CM

## 2015-11-05 DIAGNOSIS — I739 Peripheral vascular disease, unspecified: Secondary | ICD-10-CM | POA: Diagnosis not present

## 2015-11-05 DIAGNOSIS — S0990XA Unspecified injury of head, initial encounter: Secondary | ICD-10-CM | POA: Diagnosis not present

## 2015-11-05 NOTE — Patient Instructions (Signed)
Thank you for coming in today. The CT scan looks good.  Return in 1 week to remove stitches.    Laceration Care, Adult A laceration is a cut that goes through all of the layers of the skin and into the tissue that is right under the skin. Some lacerations heal on their own. Others need to be closed with stitches (sutures), staples, skin adhesive strips, or skin glue. Proper laceration care minimizes the risk of infection and helps the laceration to heal better. HOW TO CARE FOR YOUR LACERATION If sutures or staples were used:  Keep the wound clean and dry.  If you were given a bandage (dressing), you should change it at least one time per day or as told by your health care provider. You should also change it if it becomes wet or dirty.  Keep the wound completely dry for the first 24 hours or as told by your health care provider. After that time, you may shower or bathe. However, make sure that the wound is not soaked in water until after the sutures or staples have been removed.  Clean the wound one time each day or as told by your health care provider:  Wash the wound with soap and water.  Rinse the wound with water to remove all soap.  Pat the wound dry with a clean towel. Do not rub the wound.  After cleaning the wound, apply a thin layer of antibiotic ointmentas told by your health care provider. This will help to prevent infection and keep the dressing from sticking to the wound.  Have the sutures or staples removed as told by your health care provider. If skin adhesive strips were used:  Keep the wound clean and dry.  If you were given a bandage (dressing), you should change it at least one time per day or as told by your health care provider. You should also change it if it becomes dirty or wet.  Do not get the skin adhesive strips wet. You may shower or bathe, but be careful to keep the wound dry.  If the wound gets wet, pat it dry with a clean towel. Do not rub the  wound.  Skin adhesive strips fall off on their own. You may trim the strips as the wound heals. Do not remove skin adhesive strips that are still stuck to the wound. They will fall off in time. If skin glue was used:  Try to keep the wound dry, but you may briefly wet it in the shower or bath. Do not soak the wound in water, such as by swimming.  After you have showered or bathed, gently pat the wound dry with a clean towel. Do not rub the wound.  Do not do any activities that will make you sweat heavily until the skin glue has fallen off on its own.  Do not apply liquid, cream, or ointment medicine to the wound while the skin glue is in place. Using those may loosen the film before the wound has healed.  If you were given a bandage (dressing), you should change it at least one time per day or as told by your health care provider. You should also change it if it becomes dirty or wet.  If a dressing is placed over the wound, be careful not to apply tape directly over the skin glue. Doing that may cause the glue to be pulled off before the wound has healed.  Do not pick at the glue. The skin  glue usually remains in place for 5-10 days, then it falls off of the skin. General Instructions  Take over-the-counter and prescription medicines only as told by your health care provider.  If you were prescribed an antibiotic medicine or ointment, take or apply it as told by your doctor. Do not stop using it even if your condition improves.  To help prevent scarring, make sure to cover your wound with sunscreen whenever you are outside after stitches are removed, after adhesive strips are removed, or when glue remains in place and the wound is healed. Make sure to wear a sunscreen of at least 30 SPF.  Do not scratch or pick at the wound.  Keep all follow-up visits as told by your health care provider. This is important.  Check your wound every day for signs of infection. Watch for:  Redness,  swelling, or pain.  Fluid, blood, or pus.  Raise (elevate) the injured area above the level of your heart while you are sitting or lying down, if possible. SEEK MEDICAL CARE IF:  You received a tetanus shot and you have swelling, severe pain, redness, or bleeding at the injection site.  You have a fever.  A wound that was closed breaks open.  You notice a bad smell coming from your wound or your dressing.  You notice something coming out of the wound, such as wood or glass.  Your pain is not controlled with medicine.  You have increased redness, swelling, or pain at the site of your wound.  You have fluid, blood, or pus coming from your wound.  You notice a change in the color of your skin near your wound.  You need to change the dressing frequently due to fluid, blood, or pus draining from the wound.  You develop a new rash.  You develop numbness around the wound. SEEK IMMEDIATE MEDICAL CARE IF:  You develop severe swelling around the wound.  Your pain suddenly increases and is severe.  You develop painful lumps near the wound or on skin that is anywhere on your body.  You have a red streak going away from your wound.  The wound is on your hand or foot and you cannot properly move a finger or toe.  The wound is on your hand or foot and you notice that your fingers or toes look pale or bluish.   This information is not intended to replace advice given to you by your health care provider. Make sure you discuss any questions you have with your health care provider.   Document Released: 03/16/2005 Document Revised: 07/31/2014 Document Reviewed: 03/12/2014 Elsevier Interactive Patient Education [employer].

## 2015-11-05 NOTE — Progress Notes (Signed)
Alex Zavala is a 80 y.o. male who presents to [facility]: [place] today for fall head laceration. Patient fell today at [place] on his head. He suffered a laceration. He was evaluated by EMT and recommended he get stitches. He feels well without any change to his orientation. He denies severe headache. He notes it's been more than 10 years since his last tetanus shot.   Past Medical History:  Diagnosis Date  . Diabetes ([place])   . Heart disease   . Hyperlipidemia   . Hypertension    Past Surgical History:  Procedure Laterality Date  . CHOLECYSTECTOMY    . RETROPUBIC PROSTATECTOMY     Social History  Substance Use Topics  . Smoking status: Former Research scientist (life sciences)  . Smokeless tobacco: Not on file     Comment: quit smoking 50 years ago  . Alcohol use No   family history is not on file.  ROS as above:  Medications: Current Outpatient Prescriptions  Medication Sig Dispense Refill  . acetaminophen-codeine (TYLENOL #3) 300-30 MG tablet Take 1-2 tablets by mouth every 4 (four) hours as needed for moderate pain. 30 tablet 0  . AMBULATORY NON FORMULARY MEDICATION Glucometer per insurance formulary.  Glucometer test strips, use to check blood sugar daily.  Dx: Type 2 Diabetes 50 Units 11  . aspirin 81 MG tablet Take 81 mg by mouth daily.    . clotrimazole-betamethasone (LOTRISONE) cream APPLY TO AFFECTED AREA TWICE A DAY FOR TWO WEEKS, MAY REQUIRE FOUR WEEKS IF INVOLVING THE FEET/TOES. 90 g 6  . enalapril (VASOTEC) 20 MG tablet Take one and half tablet by mouth daily. 135 tablet 1  . fenofibrate (TRICOR) 48 MG tablet TAKE 1 TABLET BY MOUTH DAILY. 30 tablet 5  . glipiZIDE (GLUCOTROL) 5 MG tablet Take 1 tablet (5 mg total) by mouth 2 (two) times daily before a meal. 60 tablet 3  . hydrochlorothiazide (HYDRODIURIL) 25 MG tablet One tablet by mouth every morning for blood  pressure control. 30 tablet 2  . ipratropium (ATROVENT) 0.03 % nasal spray Place 2 sprays into both nostrils every 12 (twelve) hours. 30 mL 12  . lidocaine (XYLOCAINE) 5 % ointment Apply 1 application topically as needed. 35.44 g 0  . lovastatin (MEVACOR) 40 MG tablet Take 2 tablets (80 mg total) by mouth daily. 60 tablet 5  . meloxicam (MOBIC) 15 MG tablet One by mouth daily with a meal as needed for pain. 30 tablet 3  . metFORMIN (GLUCOPHAGE) 1000 MG tablet One by mouth twice a day for blood sugar control. 180 tablet 1  . metoprolol (LOPRESSOR) 100 MG tablet TAKE 2 TABLETs (200 MG TOTAL) BY MOUTH TWICE A DAY 180 tablet 1  . nitroGLYCERIN (NITROSTAT) 0.4 MG SL tablet Place 1 tablet (0.4 mg total) under the tongue every 5 (five) minutes as needed for chest pain. 50 tablet 3  . pantoprazole (PROTONIX) 40 MG tablet TAKE 1 TABLET BY MOUTH DAILY. 30 tablet 1  . pioglitazone (ACTOS) 45 MG tablet TAKE 1 TABLET EVERY DAY 90 tablet 1  . ranolazine (RANEXA) 500 MG 12 hr tablet Take 1 tablet (500 mg total) by mouth 2 (two) times daily. 180 tablet 1  . traMADol (ULTRAM) 50 MG tablet Take 1 tablet (50 mg total) by mouth every 8 (eight) hours as needed (back pain). 30 tablet 2   No current facility-administered medications for this visit.    Allergies  Allergen Reactions  .  Invokana [Canagliflozin]   . Januvia [Sitagliptin] Other (See Comments)    Abdominal pain     Exam:  BP 128/67   Pulse 80   Wt 164 lb (74.4 kg)   BMI 22.87 kg/m  Gen: Well NAD HEENT: EOMI,  MMM Lungs: Normal work of breathing. CTABL Heart: RRR no MRG Abd: NABS, Soft. Nondistended, Nontender Exts: Brisk capillary refill, warm and well perfused.  Skin: Stellate 2 cm laceration central forehead. No deep structures visible. Neuro: Alert and oriented. Hard of hearing. Normal balance and gait.  Laceration repair:  Consent obtained and timeout performed. Skin cleaned with alcohol and 2 mL of lidocaine with epinephrine  injected achieving good anesthesia. Wound irrigated with sterile saline. Skin prepped with chlorhexidine and draped in the usual sterile fashion. 1 horizontal mattress suture and one simple interrupted suture were used to close the wound.  Sterile dressing applied.  Patient tolerated the procedure well.     No results found for this or any previous visit (from the past 24 hour(s)). Ct Head Wo Contrast  Result Date: 11/05/2015 CLINICAL DATA:  Tripped over curb today hitting the front of the head with laceration EXAM: CT HEAD WITHOUT CONTRAST TECHNIQUE: Contiguous axial images were obtained from the base of the skull through the vertex without intravenous contrast. COMPARISON:  CT brain scan of 07/17/2007 FINDINGS: The ventricular system is minimally prominent as are the cortical sulci indicative of mild atrophy diffusely. Also mild small vessel ischemic changes noted within the periventricular white matter. No hemorrhage, mass lesion, or acute infarction is seen. The fourth ventricle and basilar cisterns are unremarkable. On bone window images, no calvarial fracture is seen. Scalp laceration is noted high over the frontal region. The paranasal sinuses that are visualized are clear. Prominent nasal turbinates do narrow the nasal airway right more so than left. IMPRESSION: 1. Mild atrophy and small vessel disease. No acute intracranial abnormality. 2. Scalp laceration high over the frontal region near the midline. 3. Narrowed nasal airway due to prominent turbinates. Electronically Signed   By: Ivar Drape M.D.   On: 11/05/2015 16:20      Assessment and Plan: 80 y.o. male with fall and head laceration. Laceration repaired and tetanus vaccine given prior to discharge. CT scan of head normal. Follow-up with myself or PCP in one week for suture removal.   Orders Placed This Encounter  Procedures  . CT Head Wo Contrast    Order Specific Question:   Reason for Exam (SYMPTOM  OR DIAGNOSIS REQUIRED)     Answer:   fall headlaceration    Order Specific Question:   Preferred imaging location?    Answer:   [facility]  . Tdap vaccine greater than or equal to 7yo IM    Discussed warning signs or symptoms. Please see discharge instructions. Patient expresses understanding.

## 2015-11-08 DIAGNOSIS — I2510 Atherosclerotic heart disease of native coronary artery without angina pectoris: Secondary | ICD-10-CM | POA: Diagnosis not present

## 2015-11-08 DIAGNOSIS — E1140 Type 2 diabetes mellitus with diabetic neuropathy, unspecified: Secondary | ICD-10-CM | POA: Diagnosis not present

## 2015-11-08 DIAGNOSIS — K219 Gastro-esophageal reflux disease without esophagitis: Secondary | ICD-10-CM | POA: Diagnosis not present

## 2015-11-08 DIAGNOSIS — E785 Hyperlipidemia, unspecified: Secondary | ICD-10-CM | POA: Diagnosis not present

## 2015-11-08 DIAGNOSIS — M1612 Unilateral primary osteoarthritis, left hip: Secondary | ICD-10-CM | POA: Diagnosis not present

## 2015-11-08 DIAGNOSIS — Z85038 Personal history of other malignant neoplasm of large intestine: Secondary | ICD-10-CM | POA: Diagnosis not present

## 2015-11-08 DIAGNOSIS — M4722 Other spondylosis with radiculopathy, cervical region: Secondary | ICD-10-CM | POA: Diagnosis not present

## 2015-11-08 DIAGNOSIS — M6281 Muscle weakness (generalized): Secondary | ICD-10-CM | POA: Diagnosis not present

## 2015-11-08 DIAGNOSIS — M25512 Pain in left shoulder: Secondary | ICD-10-CM | POA: Diagnosis not present

## 2015-11-08 DIAGNOSIS — M545 Low back pain: Secondary | ICD-10-CM | POA: Diagnosis not present

## 2015-11-08 DIAGNOSIS — Z7984 Long term (current) use of oral hypoglycemic drugs: Secondary | ICD-10-CM | POA: Diagnosis not present

## 2015-11-08 DIAGNOSIS — I10 Essential (primary) hypertension: Secondary | ICD-10-CM | POA: Diagnosis not present

## 2015-11-12 ENCOUNTER — Encounter: Payer: Self-pay | Admitting: Family Medicine

## 2015-11-12 ENCOUNTER — Ambulatory Visit (INDEPENDENT_AMBULATORY_CARE_PROVIDER_SITE_OTHER): Payer: Medicare Other | Admitting: Family Medicine

## 2015-11-12 VITALS — BP 144/77 | HR 71 | Temp 97.7°F

## 2015-11-12 DIAGNOSIS — E1140 Type 2 diabetes mellitus with diabetic neuropathy, unspecified: Secondary | ICD-10-CM | POA: Diagnosis not present

## 2015-11-12 DIAGNOSIS — Z7984 Long term (current) use of oral hypoglycemic drugs: Secondary | ICD-10-CM | POA: Diagnosis not present

## 2015-11-12 DIAGNOSIS — M6281 Muscle weakness (generalized): Secondary | ICD-10-CM | POA: Diagnosis not present

## 2015-11-12 DIAGNOSIS — K219 Gastro-esophageal reflux disease without esophagitis: Secondary | ICD-10-CM | POA: Diagnosis not present

## 2015-11-12 DIAGNOSIS — M25512 Pain in left shoulder: Secondary | ICD-10-CM | POA: Diagnosis not present

## 2015-11-12 DIAGNOSIS — M545 Low back pain: Secondary | ICD-10-CM | POA: Diagnosis not present

## 2015-11-12 DIAGNOSIS — I10 Essential (primary) hypertension: Secondary | ICD-10-CM | POA: Diagnosis not present

## 2015-11-12 DIAGNOSIS — E785 Hyperlipidemia, unspecified: Secondary | ICD-10-CM | POA: Diagnosis not present

## 2015-11-12 DIAGNOSIS — Z85038 Personal history of other malignant neoplasm of large intestine: Secondary | ICD-10-CM | POA: Diagnosis not present

## 2015-11-12 DIAGNOSIS — M1612 Unilateral primary osteoarthritis, left hip: Secondary | ICD-10-CM | POA: Diagnosis not present

## 2015-11-12 DIAGNOSIS — M4722 Other spondylosis with radiculopathy, cervical region: Secondary | ICD-10-CM | POA: Diagnosis not present

## 2015-11-12 DIAGNOSIS — S0101XA Laceration without foreign body of scalp, initial encounter: Secondary | ICD-10-CM | POA: Diagnosis not present

## 2015-11-12 DIAGNOSIS — I2510 Atherosclerotic heart disease of native coronary artery without angina pectoris: Secondary | ICD-10-CM | POA: Diagnosis not present

## 2015-11-12 NOTE — Progress Notes (Signed)
Pt is her for suture removal.

## 2015-11-12 NOTE — Progress Notes (Signed)
Alex Zavala is a 80 y.o. male who presents to [facility]: [place] today for suture check and suture removal.  Patient was seen on August 8 for scalp laceration. The scalp laceration was repaired with 2 sutures. He feels well and is asymptomatic.   Past Medical History:  Diagnosis Date  . Diabetes ([place])   . Heart disease   . Hyperlipidemia   . Hypertension    Past Surgical History:  Procedure Laterality Date  . CHOLECYSTECTOMY    . RETROPUBIC PROSTATECTOMY     Social History  Substance Use Topics  . Smoking status: Former Research scientist (life sciences)  . Smokeless tobacco: Not on file     Comment: quit smoking 50 years ago  . Alcohol use No   family history is not on file.  ROS as above:  Medications: Current Outpatient Prescriptions  Medication Sig Dispense Refill  . acetaminophen-codeine (TYLENOL #3) 300-30 MG tablet Take 1-2 tablets by mouth every 4 (four) hours as needed for moderate pain. 30 tablet 0  . AMBULATORY NON FORMULARY MEDICATION Glucometer per insurance formulary.  Glucometer test strips, use to check blood sugar daily.  Dx: Type 2 Diabetes 50 Units 11  . aspirin 81 MG tablet Take 81 mg by mouth daily.    . clotrimazole-betamethasone (LOTRISONE) cream APPLY TO AFFECTED AREA TWICE A DAY FOR TWO WEEKS, MAY REQUIRE FOUR WEEKS IF INVOLVING THE FEET/TOES. 90 g 6  . enalapril (VASOTEC) 20 MG tablet Take one and half tablet by mouth daily. 135 tablet 1  . fenofibrate (TRICOR) 48 MG tablet TAKE 1 TABLET BY MOUTH DAILY. 30 tablet 5  . glipiZIDE (GLUCOTROL) 5 MG tablet Take 1 tablet (5 mg total) by mouth 2 (two) times daily before a meal. 60 tablet 3  . hydrochlorothiazide (HYDRODIURIL) 25 MG tablet One tablet by mouth every morning for blood pressure control. 30 tablet 2  . ipratropium (ATROVENT) 0.03 % nasal spray Place 2 sprays into both nostrils every 12 (twelve) hours. 30 mL  12  . lidocaine (XYLOCAINE) 5 % ointment Apply 1 application topically as needed. 35.44 g 0  . lovastatin (MEVACOR) 40 MG tablet Take 2 tablets (80 mg total) by mouth daily. 60 tablet 5  . meloxicam (MOBIC) 15 MG tablet One by mouth daily with a meal as needed for pain. 30 tablet 3  . metFORMIN (GLUCOPHAGE) 1000 MG tablet One by mouth twice a day for blood sugar control. 180 tablet 1  . metoprolol (LOPRESSOR) 100 MG tablet TAKE 2 TABLETs (200 MG TOTAL) BY MOUTH TWICE A DAY 180 tablet 1  . nitroGLYCERIN (NITROSTAT) 0.4 MG SL tablet Place 1 tablet (0.4 mg total) under the tongue every 5 (five) minutes as needed for chest pain. 50 tablet 3  . pantoprazole (PROTONIX) 40 MG tablet TAKE 1 TABLET BY MOUTH DAILY. 30 tablet 1  . pioglitazone (ACTOS) 45 MG tablet TAKE 1 TABLET EVERY DAY 90 tablet 1  . ranolazine (RANEXA) 500 MG 12 hr tablet Take 1 tablet (500 mg total) by mouth 2 (two) times daily. 180 tablet 1  . traMADol (ULTRAM) 50 MG tablet Take 1 tablet (50 mg total) by mouth every 8 (eight) hours as needed (back pain). 30 tablet 2   No current facility-administered medications for this visit.    Allergies  Allergen Reactions  . Invokana [Canagliflozin]   . Januvia [Sitagliptin] Other (See Comments)    Abdominal pain     Exam:  BP Marland Kitchen)  144/77 (BP Location: Right Arm, Patient Position: Sitting, Cuff Size: Normal)   Pulse 71   Temp 97.7 F (36.5 C) (Oral)   SpO2 100%  Gen: Well NAD Nontoxic appearing Skin: Well-appearing laceration with 2 sutures. Sutures removed    No results found for this or any previous visit (from the past 24 hour(s)). No results found.    Assessment and Plan: 80 y.o. male with scalp laceration. Doing well. Follow-up with PCP.   No orders of the defined types were placed in this encounter.   Discussed warning signs or symptoms. Please see discharge instructions. Patient expresses understanding.

## 2015-11-14 DIAGNOSIS — M6281 Muscle weakness (generalized): Secondary | ICD-10-CM | POA: Diagnosis not present

## 2015-11-14 DIAGNOSIS — K219 Gastro-esophageal reflux disease without esophagitis: Secondary | ICD-10-CM | POA: Diagnosis not present

## 2015-11-14 DIAGNOSIS — I10 Essential (primary) hypertension: Secondary | ICD-10-CM | POA: Diagnosis not present

## 2015-11-14 DIAGNOSIS — M4722 Other spondylosis with radiculopathy, cervical region: Secondary | ICD-10-CM | POA: Diagnosis not present

## 2015-11-14 DIAGNOSIS — M1612 Unilateral primary osteoarthritis, left hip: Secondary | ICD-10-CM | POA: Diagnosis not present

## 2015-11-14 DIAGNOSIS — M25512 Pain in left shoulder: Secondary | ICD-10-CM | POA: Diagnosis not present

## 2015-11-14 DIAGNOSIS — I2510 Atherosclerotic heart disease of native coronary artery without angina pectoris: Secondary | ICD-10-CM | POA: Diagnosis not present

## 2015-11-14 DIAGNOSIS — Z7984 Long term (current) use of oral hypoglycemic drugs: Secondary | ICD-10-CM | POA: Diagnosis not present

## 2015-11-14 DIAGNOSIS — Z85038 Personal history of other malignant neoplasm of large intestine: Secondary | ICD-10-CM | POA: Diagnosis not present

## 2015-11-14 DIAGNOSIS — M545 Low back pain: Secondary | ICD-10-CM | POA: Diagnosis not present

## 2015-11-14 DIAGNOSIS — E785 Hyperlipidemia, unspecified: Secondary | ICD-10-CM | POA: Diagnosis not present

## 2015-11-14 DIAGNOSIS — E1140 Type 2 diabetes mellitus with diabetic neuropathy, unspecified: Secondary | ICD-10-CM | POA: Diagnosis not present

## 2015-11-20 ENCOUNTER — Ambulatory Visit (INDEPENDENT_AMBULATORY_CARE_PROVIDER_SITE_OTHER): Payer: Medicare Other | Admitting: Osteopathic Medicine

## 2015-11-20 ENCOUNTER — Encounter: Payer: Self-pay | Admitting: Osteopathic Medicine

## 2015-11-20 VITALS — BP 145/75 | HR 75 | Ht 71.0 in | Wt 154.0 lb

## 2015-11-20 DIAGNOSIS — R05 Cough: Secondary | ICD-10-CM

## 2015-11-20 DIAGNOSIS — R042 Hemoptysis: Secondary | ICD-10-CM

## 2015-11-20 DIAGNOSIS — C61 Malignant neoplasm of prostate: Secondary | ICD-10-CM | POA: Diagnosis not present

## 2015-11-20 DIAGNOSIS — E1121 Type 2 diabetes mellitus with diabetic nephropathy: Secondary | ICD-10-CM

## 2015-11-20 DIAGNOSIS — R81 Glycosuria: Secondary | ICD-10-CM | POA: Diagnosis not present

## 2015-11-20 DIAGNOSIS — Z85038 Personal history of other malignant neoplasm of large intestine: Secondary | ICD-10-CM

## 2015-11-20 DIAGNOSIS — M7989 Other specified soft tissue disorders: Secondary | ICD-10-CM | POA: Diagnosis not present

## 2015-11-20 DIAGNOSIS — R351 Nocturia: Secondary | ICD-10-CM | POA: Diagnosis not present

## 2015-11-20 DIAGNOSIS — R799 Abnormal finding of blood chemistry, unspecified: Secondary | ICD-10-CM | POA: Diagnosis not present

## 2015-11-20 DIAGNOSIS — Z23 Encounter for immunization: Secondary | ICD-10-CM | POA: Diagnosis not present

## 2015-11-20 DIAGNOSIS — R1013 Epigastric pain: Secondary | ICD-10-CM | POA: Diagnosis not present

## 2015-11-20 DIAGNOSIS — R059 Cough, unspecified: Secondary | ICD-10-CM

## 2015-11-20 DIAGNOSIS — R350 Frequency of micturition: Secondary | ICD-10-CM | POA: Diagnosis not present

## 2015-11-20 DIAGNOSIS — B351 Tinea unguium: Secondary | ICD-10-CM

## 2015-11-20 DIAGNOSIS — I2510 Atherosclerotic heart disease of native coronary artery without angina pectoris: Secondary | ICD-10-CM

## 2015-11-20 LAB — CBC WITH DIFFERENTIAL/PLATELET
BASOS ABS: 0 {cells}/uL (ref 0–200)
Basophils Relative: 0 %
EOS ABS: 216 {cells}/uL (ref 15–500)
EOS PCT: 4 %
HCT: 37 % — ABNORMAL LOW (ref 38.5–50.0)
HEMOGLOBIN: 12.1 g/dL — AB (ref 13.2–17.1)
Lymphocytes Relative: 17 %
Lymphs Abs: 918 cells/uL (ref 850–3900)
MCH: 28.9 pg (ref 27.0–33.0)
MCHC: 32.7 g/dL (ref 32.0–36.0)
MCV: 88.3 fL (ref 80.0–100.0)
MONOS PCT: 16 %
MPV: 10.3 fL (ref 7.5–12.5)
Monocytes Absolute: 864 cells/uL (ref 200–950)
NEUTROS PCT: 63 %
Neutro Abs: 3402 cells/uL (ref 1500–7800)
Platelets: 353 10*3/uL (ref 140–400)
RBC: 4.19 MIL/uL — ABNORMAL LOW (ref 4.20–5.80)
RDW: 14.6 % (ref 11.0–15.0)
WBC: 5.4 10*3/uL (ref 3.8–10.8)

## 2015-11-20 NOTE — Patient Instructions (Signed)
Breathing/Coughing: Dr Ileene Rubens placed a referral to [facility] (lung doctor) a few years ago, I do not see that you ever went to see them. You may need a procedure to look at the lungs. We will get an X-ray of the chest today.   Swelling: Try compression stockings (available at most pharmacies) and keeping feet and legs elevated during the day. I think it's a circulation issue, it may require further tests but as long as not painful or associated with chest pain or shortness of breath, I'm less concerned for a heart problem. We will get some labs as well.   Pain in side: I think most likely stomach issue or muscle/skeleton pain from hunching and history of falls. Try over-the-counter antacid if you feel burning pain. Would discuss this with GI, you need to follow up with them anyway for colonoscopy and follow-up form cancer.

## 2015-11-20 NOTE — Progress Notes (Signed)
HPI: Alex Zavala is a 80 y.o. male  who presents to [facility] today, 11/20/15,  for chief complaint of:  Chief Complaint  Patient presents with  . Establish Care  . Follow-up    LEFT SHOULDER     Coughing - reports occasional coughing up brownish stuff and chest tightness, feels fine now. Similar problem noted by Dr Ileene Rubens, CT was negative and Pulm referral placed but patient cannot recall ever seeing a lung doctor. Thi was all in 2014. No SOB on rest/exertion.  CT chest "IMPRESSION: 1. No pulmonary nodule or mass is seen. Only a tiny 3 mm noncalcified nodule is present in the left upper lobe of doubtful significance in this age patient. 2. Enlarged thyroid with small low attenuation nodules most consistent with multinodular goiter. Correlate clinically. 3. Considerable coronary artery calcifications with cardiomegaly."  Epigastric pain - burning, intermittent, not correlated to food, low appetite ongoing. Pt has hx colon cancer and due for followup with GI but he states he is nervous to repeat colonoscopy due to anesthesia. No nausea/vomiting. No dysphagia or early satiety.   Shoulder pain - previous A/P notes from Dr. Darene Lamer. Reviewed.Initial diagnosis 1 year ago - Cervical Spondylosis w/ Radiculopathy, pt underwent home Rehab, MRI, recent PT reordered. Last seen 10/23/15 by Dr Ileene Rubens for this issue, he refilled Tramadol and Mobic. S/p injection by Dr. Darene Lamer. To L Subacromial Bursa 10/29/2014. Not bothering him too much today.   LE swelling - B/l ankles pronounced swelling, no calf pain. No orthopnea or chest pain.   DM2 - last A1C good control. Foot exam today no neuropathy but significant onychomycosis and tinea    Patient is very hard of hearing, speech is difficult to interpret but no facial droop or slurring, I suspect due to severe hearing difficulty. History poor, we had to repeat ourselves quite a bit to get any understanding. Notes in record show  suspicion that his hearing issues preclude a lot of his specialist referrals - can't schedule anything over the phone   Past medical, surgical, social and family history reviewed: Past Medical History:  Diagnosis Date  . Diabetes ([place])   . Heart disease   . Hyperlipidemia   . Hypertension    Past Surgical History:  Procedure Laterality Date  . CHOLECYSTECTOMY    . RETROPUBIC PROSTATECTOMY     Social History  Substance Use Topics  . Smoking status: Former Research scientist (life sciences)  . Smokeless tobacco: Not on file     Comment: quit smoking 50 years ago  . Alcohol use No   Family History  Problem Relation Age of Onset  . Hyperlipidemia    . Hypertension       Current medication list and allergy/intolerance information reviewed:   Current Outpatient Prescriptions  Medication Sig Dispense Refill  . acetaminophen-codeine (TYLENOL #3) 300-30 MG tablet Take 1-2 tablets by mouth every 4 (four) hours as needed for moderate pain. 30 tablet 0  . AMBULATORY NON FORMULARY MEDICATION Glucometer per insurance formulary.  Glucometer test strips, use to check blood sugar daily.  Dx: Type 2 Diabetes 50 Units 11  . aspirin 81 MG tablet Take 81 mg by mouth daily.    . clotrimazole-betamethasone (LOTRISONE) cream APPLY TO AFFECTED AREA TWICE A DAY FOR TWO WEEKS, MAY REQUIRE FOUR WEEKS IF INVOLVING THE FEET/TOES. 90 g 6  . enalapril (VASOTEC) 20 MG tablet Take one and half tablet by mouth daily. 135 tablet 1  . fenofibrate (TRICOR) 48 MG tablet  TAKE 1 TABLET BY MOUTH DAILY. 30 tablet 5  . glipiZIDE (GLUCOTROL) 5 MG tablet Take 1 tablet (5 mg total) by mouth 2 (two) times daily before a meal. 60 tablet 3  . hydrochlorothiazide (HYDRODIURIL) 25 MG tablet One tablet by mouth every morning for blood pressure control. 30 tablet 2  . ipratropium (ATROVENT) 0.03 % nasal spray Place 2 sprays into both nostrils every 12 (twelve) hours. 30 mL 12  . lidocaine (XYLOCAINE) 5 % ointment Apply 1 application topically as needed.  35.44 g 0  . lovastatin (MEVACOR) 40 MG tablet Take 2 tablets (80 mg total) by mouth daily. 60 tablet 5  . meloxicam (MOBIC) 15 MG tablet One by mouth daily with a meal as needed for pain. 30 tablet 3  . metFORMIN (GLUCOPHAGE) 1000 MG tablet One by mouth twice a day for blood sugar control. 180 tablet 1  . metoprolol (LOPRESSOR) 100 MG tablet TAKE 2 TABLETs (200 MG TOTAL) BY MOUTH TWICE A DAY 180 tablet 1  . nitroGLYCERIN (NITROSTAT) 0.4 MG SL tablet Place 1 tablet (0.4 mg total) under the tongue every 5 (five) minutes as needed for chest pain. 50 tablet 3  . pantoprazole (PROTONIX) 40 MG tablet TAKE 1 TABLET BY MOUTH DAILY. 30 tablet 1  . pioglitazone (ACTOS) 45 MG tablet TAKE 1 TABLET EVERY DAY 90 tablet 1  . ranolazine (RANEXA) 500 MG 12 hr tablet Take 1 tablet (500 mg total) by mouth 2 (two) times daily. 180 tablet 1  . traMADol (ULTRAM) 50 MG tablet Take 1 tablet (50 mg total) by mouth every 8 (eight) hours as needed (back pain). 30 tablet 2   No current facility-administered medications for this visit.    Allergies  Allergen Reactions  . Invokana [Canagliflozin]   . Januvia [Sitagliptin] Other (See Comments)    Abdominal pain      Review of Systems:   HEENT: No  headache,  Cardiac: No  chest pain, No  Orthopnea, (+) LE swelling   Respiratory:  No  shortness of breath. (+) ?productvi  Cough  Gastrointestinal: (+) EPIGASTRIC abdominal pain, No  nausea, No  vomiting,  (+) GErd  Musculoskeletal: No new myalgia/arthralgia  Neurologic: No  weakness, No  dizziness   Exam:  BP (!) 145/75   Pulse 75   Ht 5' 11"  (1.803 m)   Wt 154 lb (69.9 kg)   BMI 21.48 kg/m   Constitutional: VS see above. General Appearance: alert, well-developed, well-nourished, NAD  Eyes: Normal lids and conjunctive, non-icteric sclera  Ears, Nose, Mouth, Throat: MMM,  Neck: No masses, trachea midline.   Respiratory: Normal respiratory effort. no wheeze, no rhonchi, no rales  Cardiovascular:  S1/S2 normal, no murmur, no rub/gallop auscultated. RRR. (+)2 lower extremity edema. To mid-tibia  Gastrointestinal: Nontender, no masses. No hepatomegaly, no splenomegaly.   Musculoskeletal: Gait normal. No clubbing/cyanosis of digits. (+) pronounced thoracic kyphosis  Neurological: Normal balance/coordination. No tremor.   Skin: warm, dry, intact. No rash/ulcer. No concerning nevi or subq nodules on limited exam.  (+) severe onychomycosis     Results for orders placed or performed in visit on 11/20/15 (from the past 72 hour(s))  CBC with Differential/Platelet     Status: Abnormal   Collection Time: 11/20/15  2:09 PM  Result Value Ref Range   WBC 5.4 3.8 - 10.8 K/uL   RBC 4.19 (L) 4.20 - 5.80 MIL/uL   Hemoglobin 12.1 (L) 13.2 - 17.1 g/dL   HCT 37.0 (L) 38.5 - 50.0 %  MCV 88.3 80.0 - 100.0 fL   MCH 28.9 27.0 - 33.0 pg   MCHC 32.7 32.0 - 36.0 g/dL   RDW 14.6 11.0 - 15.0 %   Platelets 353 140 - 400 K/uL   MPV 10.3 7.5 - 12.5 fL   Neutro Abs 3,402 1,500 - 7,800 cells/uL   Lymphs Abs 918 850 - 3,900 cells/uL   Monocytes Absolute 864 200 - 950 cells/uL   Eosinophils Absolute 216 15 - 500 cells/uL   Basophils Absolute 0 0 - 200 cells/uL   Neutrophils Relative % 63 %   Lymphocytes Relative 17 %   Monocytes Relative 16 %   Eosinophils Relative 4 %   Basophils Relative 0 %   Smear Review Criteria for review not met   COMPLETE METABOLIC PANEL WITH GFR     Status: Abnormal   Collection Time: 11/20/15  2:09 PM  Result Value Ref Range   Sodium 141 135 - 146 mmol/L   Potassium 4.4 3.5 - 5.3 mmol/L   Chloride 98 98 - 110 mmol/L   CO2 32 (H) 20 - 31 mmol/L   Glucose, Bld 155 (H) 65 - 99 mg/dL   BUN 19 7 - 25 mg/dL   Creat 1.32 (H) 0.70 - 1.18 mg/dL    Comment:   For patients > or = 78 years of age: The upper reference limit for Creatinine is approximately 13% higher for people identified as African-American.      Total Bilirubin 0.5 0.2 - 1.2 mg/dL   Alkaline Phosphatase 44 40  - 115 U/L   AST 13 10 - 35 U/L   ALT 9 9 - 46 U/L   Total Protein 6.7 6.1 - 8.1 g/dL   Albumin 3.7 3.6 - 5.1 g/dL   Calcium 9.5 8.6 - 10.3 mg/dL   GFR, Est African American 59 (L) >=60 mL/min   GFR, Est Non African American 51 (L) >=60 mL/min  Lipid panel     Status: None   Collection Time: 11/20/15  2:09 PM  Result Value Ref Range   Cholesterol 160 125 - 200 mg/dL   Triglycerides 126 <150 mg/dL   HDL 51 >=40 mg/dL   Total CHOL/HDL Ratio 3.1 <=5.0 Ratio   VLDL 25 <30 mg/dL   LDL Cholesterol 84 <130 mg/dL    Comment:   Total Cholesterol/HDL Ratio:CHD Risk                        Coronary Heart Disease Risk Table                                        Men       Women          1/2 Average Risk              3.4        3.3              Average Risk              5.0        4.4           2X Average Risk              9.6        7.1           3X Average Risk  23.4       11.0 Use the calculated Patient Ratio above and the CHD Risk table  to determine the patient's CHD Risk.   TSH     Status: None   Collection Time: 11/20/15  2:09 PM  Result Value Ref Range   TSH 0.96 0.40 - 4.50 mIU/L  B Nat Peptide     Status: Abnormal   Collection Time: 11/20/15  2:09 PM  Result Value Ref Range   Brain Natriuretic Peptide 303.3 (H) <100 pg/mL    Comment:   BNP levels increase with age in the general population with the highest values seen in individuals greater than 45 years of age. Reference: Joellyn Rued Cardiol 2002; 23:300-76.     Urinalysis     Status: None   Collection Time: 11/20/15  2:09 PM  Result Value Ref Range   Color, Urine CANCELED YELLOW    Comment: The preferred specimen for urinalysis testing is the [employer] urine collection tube. Effective 12/21/2015, [employer], a [employer], will reject any urine cup received that is not transferred into the preferred [employer] tube. Using this device, specimen components remain stable in  transit up to 72 hours at ambient temperatures. Urine may be collected in a clean, unused cup and transferred to the yellow cap transfer tube. Supply order number is: [id number]. If you have any questions, please contact your [facility] Account Representative directly, or call our Customer Service Department at [phone]. Test not performed, no urine was received.    Result canceled by the ancillary    APPearance CANCELED CLEAR    Comment: Result canceled by the ancillary   Specific Gravity, Urine CANCELED 1.001 - 1.035    Comment: Result canceled by the ancillary   pH CANCELED 5.0 - 8.0    Comment: Result canceled by the ancillary   Glucose, UA CANCELED NEGATIVE    Comment: Result canceled by the ancillary   Bilirubin Urine CANCELED NEGATIVE    Comment: Result canceled by the ancillary   Ketones, ur CANCELED NEGATIVE    Comment: Result canceled by the ancillary   Hgb urine dipstick CANCELED NEGATIVE    Comment: Result canceled by the ancillary   Protein, ur CANCELED NEGATIVE    Comment: Result canceled by the ancillary   Nitrite CANCELED NEGATIVE    Comment: Result canceled by the ancillary   Leukocytes, UA CANCELED NEGATIVE    Comment: Result canceled by the ancillary  Urine Microalbumin w/creat. ratio     Status: None   Collection Time: 11/20/15  2:09 PM  Result Value Ref Range   Creatinine, Urine CANCELED 20 - 370 mg/dL    Comment: Test not performed, no urine was received.    Result canceled by the ancillary    Microalb, Ur CANCELED Not estab mg/dL    Comment: Test not performed, no urine was received.    Result canceled by the ancillary    Microalb Creat Ratio CANCELED <30 mcg/mg creat    Comment: The ADA has defined abnormalities in albumin excretion as follows:           Category           Result                            (mcg/mg creatinine)                 Normal:    <30  Microalbuminuria:    30 - 299   Clinical albuminuria:    > or = 300    The ADA recommends that at least two of three specimens collected within a 3 - 6 month period be abnormal before considering a patient to be within a diagnostic category.    Result canceled by the ancillary     No results found.   ASSESSMENT/PLAN:   Leg swelling - Doesn't appear to be clinical heart failure, ?hepatic congestion causing his abdominal pain but I doubt this. Check BMP, lytes, UA - Plan: COMPLETE METABOLIC PANEL WITH GFR, B Nat Peptide, Urinalysis, Urine Microalbumin w/creat. ratio  Hemoptysis - Needs follow up with Pulm, this referral was placed long ago, ?hemoptysis vs regurgitation? He did not get CXR done  - Plan: CBC with Differential/Platelet  Abdominal pain, epigastric - Plan: COMPLETE METABOLIC PANEL WITH GFR  Type 2 diabetes with nephropathy (HCC) - Plan: COMPLETE METABOLIC PANEL WITH GFR, Lipid panel, TSH  Cough - Plan: DG Chest 2 View  History of colon cancer, stage II - Needs follow up with GI!   Onychomycosis - Plan: Ambulatory referral to Podiatry  Need for prophylactic vaccination and inoculation against influenza - Plan: Flu Vaccine QUAD 36+ mos IM  Atherosclerosis of native coronary artery of native heart without angina pectoris     Visit summary with medication list and pertinent instructions was printed for patient to review. All questions at time of visit were answered - patient instructed to contact office with any additional concerns. ER/RTC precautions were reviewed with the patient. Follow-up plan: Return in about 4 weeks (around 12/18/2015) for REVIEW LAB RESULTS, RECHECK YOUR BREATHING AND PAIN PROBLEMS.  Note: Total time spent 40 minutes, greater than 50% of the visit was spent face-to-face counseling and coordinating care for the following: The primary encounter diagnosis was Leg swelling. Diagnoses of Hemoptysis, Abdominal pain, epigastric, Type 2 diabetes with nephropathy ([place]), Cough, History of colon cancer, stage II, Onychomycosis,  Need for prophylactic vaccination and inoculation against influenza, and Atherosclerosis of native coronary artery of native heart without angina pectoris were also pertinent to this visit.Marland Kitchen

## 2015-11-21 LAB — COMPLETE METABOLIC PANEL WITH GFR
ALT: 9 U/L (ref 9–46)
AST: 13 U/L (ref 10–35)
Albumin: 3.7 g/dL (ref 3.6–5.1)
Alkaline Phosphatase: 44 U/L (ref 40–115)
BUN: 19 mg/dL (ref 7–25)
CALCIUM: 9.5 mg/dL (ref 8.6–10.3)
CHLORIDE: 98 mmol/L (ref 98–110)
CO2: 32 mmol/L — ABNORMAL HIGH (ref 20–31)
Creat: 1.32 mg/dL — ABNORMAL HIGH (ref 0.70–1.18)
GFR, EST NON AFRICAN AMERICAN: 51 mL/min — AB (ref >=60)
GFR, Est African American: 59 mL/min — ABNORMAL LOW (ref >=60)
GLUCOSE: 155 mg/dL — AB (ref 65–99)
POTASSIUM: 4.4 mmol/L (ref 3.5–5.3)
SODIUM: 141 mmol/L (ref 135–146)
Total Bilirubin: 0.5 mg/dL (ref 0.2–1.2)
Total Protein: 6.7 g/dL (ref 6.1–8.1)

## 2015-11-21 LAB — LIPID PANEL
CHOL/HDL RATIO: 3.1 ratio (ref <=5.0)
CHOLESTEROL: 160 mg/dL (ref 125–200)
HDL: 51 mg/dL (ref >=40)
LDL Cholesterol: 84 mg/dL (ref <130)
Triglycerides: 126 mg/dL (ref <150)
VLDL: 25 mg/dL (ref <30)

## 2015-11-21 LAB — URINALYSIS

## 2015-11-21 LAB — MICROALBUMIN / CREATININE URINE RATIO

## 2015-11-21 LAB — BRAIN NATRIURETIC PEPTIDE: BRAIN NATRIURETIC PEPTIDE: 303.3 pg/mL — AB (ref <100)

## 2015-11-21 LAB — TSH: TSH: 0.96 mIU/L (ref 0.40–4.50)

## 2015-11-22 ENCOUNTER — Encounter: Payer: Self-pay | Admitting: Osteopathic Medicine

## 2015-11-29 ENCOUNTER — Other Ambulatory Visit: Payer: Self-pay | Admitting: Family Medicine

## 2015-11-29 DIAGNOSIS — M25512 Pain in left shoulder: Secondary | ICD-10-CM

## 2015-12-04 ENCOUNTER — Other Ambulatory Visit: Payer: Self-pay | Admitting: Osteopathic Medicine

## 2015-12-04 DIAGNOSIS — S2231XA Fracture of one rib, right side, initial encounter for closed fracture: Secondary | ICD-10-CM

## 2015-12-04 MED ORDER — ACETAMINOPHEN-CODEINE #3 300-30 MG PO TABS: 1.0000 | ORAL_TABLET | ORAL | 0 refills | Status: DC | PRN

## 2015-12-18 ENCOUNTER — Ambulatory Visit: Payer: Self-pay | Admitting: Osteopathic Medicine

## 2015-12-18 ENCOUNTER — Ambulatory Visit (INDEPENDENT_AMBULATORY_CARE_PROVIDER_SITE_OTHER): Payer: Medicare Other | Admitting: Osteopathic Medicine

## 2015-12-18 VITALS — BP 190/103 | HR 89 | Wt 144.0 lb

## 2015-12-18 DIAGNOSIS — N183 Chronic kidney disease, stage 3 unspecified: Secondary | ICD-10-CM

## 2015-12-18 DIAGNOSIS — I10 Essential (primary) hypertension: Secondary | ICD-10-CM

## 2015-12-18 DIAGNOSIS — R042 Hemoptysis: Secondary | ICD-10-CM

## 2015-12-18 DIAGNOSIS — Z85038 Personal history of other malignant neoplasm of large intestine: Secondary | ICD-10-CM

## 2015-12-18 DIAGNOSIS — W19XXXA Unspecified fall, initial encounter: Secondary | ICD-10-CM | POA: Diagnosis not present

## 2015-12-18 DIAGNOSIS — Z79899 Other long term (current) drug therapy: Secondary | ICD-10-CM

## 2015-12-18 DIAGNOSIS — E1121 Type 2 diabetes mellitus with diabetic nephropathy: Secondary | ICD-10-CM | POA: Diagnosis not present

## 2015-12-18 DIAGNOSIS — R05 Cough: Secondary | ICD-10-CM | POA: Diagnosis not present

## 2015-12-18 DIAGNOSIS — R059 Cough, unspecified: Secondary | ICD-10-CM

## 2015-12-18 DIAGNOSIS — B351 Tinea unguium: Secondary | ICD-10-CM

## 2015-12-18 NOTE — Progress Notes (Signed)
HPI: Alex Zavala is a 80 y.o. male  who presents to [place] today, 12/18/15,  for chief complaint of:  Chief Complaint  Patient presents with  . Fall    last week    Patient here for follow-up. At last visit, 11/20/2015, he was complaining of coughing and chest tightness. This is a chronic problem for him. Pulmonology referral has been placed for him but I think due to hard of hearing he has not been able to schedule appointments over the phone. Same problem with recent podiatry referral.   Patient reports falling last week and "ever since then I just feel right, my back hurts." No change in gait, he has not taken any pain medications.  Hypertension: Blood pressure elevated today, patient states that he doesn't think he took his blood pressure medicines.  Diabetes: Recent A1c well below goal for 80 year old with multiple medical problems.  He seems to have a hard time recalling names of his medications or what medications he takes for which medical problem. He does not have a list or pill bottles with him.  Patient is very hard of hearing, speech is difficult to interpret but no facial droop or slurring, I suspect due to severe hearing difficulty. History poor, we had to repeat ourselves quite a bit to get any understanding. I suspect that his hearing issues preclude a lot of his specialist referrals - can't schedule anything over the phone      Past medical, surgical, social and family history reviewed: Past Medical History:  Diagnosis Date  . Diabetes ([place])   . Heart disease   . Hyperlipidemia   . Hypertension    Past Surgical History:  Procedure Laterality Date  . CHOLECYSTECTOMY    . RETROPUBIC PROSTATECTOMY     Social History  Substance Use Topics  . Smoking status: Former Research scientist (life sciences)  . Smokeless tobacco: Never Used     Comment: quit smoking 50 years ago  . Alcohol use No   Family History  Problem Relation Age of Onset  .  Hyperlipidemia    . Hypertension       Current medication list and allergy/intolerance information reviewed:   Current Outpatient Prescriptions  Medication Sig Dispense Refill  . acetaminophen-codeine (TYLENOL #3) 300-30 MG tablet Take 1 tablet by mouth every 4 (four) hours as needed for moderate pain or severe pain. 30 tablet 0  . AMBULATORY NON FORMULARY MEDICATION Glucometer per insurance formulary.  Glucometer test strips, use to check blood sugar daily.  Dx: Type 2 Diabetes 50 Units 11  . aspirin 81 MG tablet Take 81 mg by mouth daily.    . clotrimazole-betamethasone (LOTRISONE) cream APPLY TO AFFECTED AREA TWICE A DAY FOR TWO WEEKS, MAY REQUIRE FOUR WEEKS IF INVOLVING THE FEET/TOES. 90 g 6  . enalapril (VASOTEC) 20 MG tablet Take one and half tablet by mouth daily. 135 tablet 1  . fenofibrate (TRICOR) 48 MG tablet TAKE 1 TABLET BY MOUTH DAILY. 30 tablet 5  . glipiZIDE (GLUCOTROL) 5 MG tablet Take 1 tablet (5 mg total) by mouth 2 (two) times daily before a meal. 60 tablet 3  . hydrochlorothiazide (HYDRODIURIL) 25 MG tablet One tablet by mouth every morning for blood pressure control. 30 tablet 2  . ipratropium (ATROVENT) 0.03 % nasal spray Place 2 sprays into both nostrils every 12 (twelve) hours. 30 mL 12  . lidocaine (XYLOCAINE) 5 % ointment Apply 1 application topically as needed. 35.44 g 0  . lovastatin (  MEVACOR) 40 MG tablet Take 2 tablets (80 mg total) by mouth daily. 60 tablet 5  . meloxicam (MOBIC) 15 MG tablet One by mouth daily with a meal as needed for pain. 30 tablet 3  . metFORMIN (GLUCOPHAGE) 1000 MG tablet One by mouth twice a day for blood sugar control. 180 tablet 1  . metoprolol (LOPRESSOR) 100 MG tablet TAKE 2 TABLETs (200 MG TOTAL) BY MOUTH TWICE A DAY 180 tablet 1  . nitroGLYCERIN (NITROSTAT) 0.4 MG SL tablet Place 1 tablet (0.4 mg total) under the tongue every 5 (five) minutes as needed for chest pain. 50 tablet 3  . pantoprazole (PROTONIX) 40 MG tablet TAKE 1 TABLET  BY MOUTH DAILY. 30 tablet 1  . pioglitazone (ACTOS) 45 MG tablet TAKE 1 TABLET EVERY DAY 90 tablet 1  . ranolazine (RANEXA) 500 MG 12 hr tablet Take 1 tablet (500 mg total) by mouth 2 (two) times daily. 180 tablet 1  . traMADol (ULTRAM) 50 MG tablet Take 1 tablet (50 mg total) by mouth every 8 (eight) hours as needed (back pain). 30 tablet 2   No current facility-administered medications for this visit.    Allergies  Allergen Reactions  . Invokana [Canagliflozin]   . Januvia [Sitagliptin] Other (See Comments)    Abdominal pain      Review of Systems:  Constitutional:  No  fever, no chills, No recent illness.   HEENT: No  headache, no vision change  Cardiac: No  chest pain  Respiratory:  No  shortness of breath. + Chronic Cough  Gastrointestinal: No  abdominal pain  Musculoskeletal: No new myalgia/arthralgia - chronic back pain exacerbated by recent fall  Neurologic: No  dizziness  Exam:  BP (!) 190/103   Pulse 89   Wt 144 lb (65.3 kg)   BMI 20.08 kg/m   Constitutional: VS see above. General Appearance: alert, well-developed, well-nourished, NAD  Eyes: Normal lids and conjunctive, non-icteric sclera  Ears, Nose, Mouth, Throat: MMM  Neck: No masses, trachea midline.  Respiratory: Normal respiratory effort. no wheeze, no rhonchi, no rales  Cardiovascular: S1/S2 normal, no murmur, no rub/gallop auscultated. RRR.   Gastrointestinal: Nontender, no masses.  Musculoskeletal: Gait normal. Pronounced thoracic kyphosis. No midline spinal tenderness  Neurological: No cranial nerve deficit on limited exam. No tremor.   Psychiatric: Fair judgment/insight. Normal mood and affect. Oriented x3.    Recent Results (from the past 2160 hour(s))  POCT HgB A1C     Status: None   Collection Time: 10/07/15 11:40 AM  Result Value Ref Range   Hemoglobin A1C 6.3   CBC with Differential/Platelet     Status: Abnormal   Collection Time: 11/20/15  2:09 PM  Result Value Ref Range    WBC 5.4 3.8 - 10.8 K/uL   RBC 4.19 (L) 4.20 - 5.80 MIL/uL   Hemoglobin 12.1 (L) 13.2 - 17.1 g/dL   HCT 37.0 (L) 38.5 - 50.0 %   MCV 88.3 80.0 - 100.0 fL   MCH 28.9 27.0 - 33.0 pg   MCHC 32.7 32.0 - 36.0 g/dL   RDW 14.6 11.0 - 15.0 %   Platelets 353 140 - 400 K/uL   MPV 10.3 7.5 - 12.5 fL   Neutro Abs 3,402 1,500 - 7,800 cells/uL   Lymphs Abs 918 850 - 3,900 cells/uL   Monocytes Absolute 864 200 - 950 cells/uL   Eosinophils Absolute 216 15 - 500 cells/uL   Basophils Absolute 0 0 - 200 cells/uL   Neutrophils Relative %  63 %   Lymphocytes Relative 17 %   Monocytes Relative 16 %   Eosinophils Relative 4 %   Basophils Relative 0 %   Smear Review Criteria for review not met   COMPLETE METABOLIC PANEL WITH GFR     Status: Abnormal   Collection Time: 11/20/15  2:09 PM  Result Value Ref Range   Sodium 141 135 - 146 mmol/L   Potassium 4.4 3.5 - 5.3 mmol/L   Chloride 98 98 - 110 mmol/L   CO2 32 (H) 20 - 31 mmol/L   Glucose, Bld 155 (H) 65 - 99 mg/dL   BUN 19 7 - 25 mg/dL   Creat 1.32 (H) 0.70 - 1.18 mg/dL    Comment:   For patients > or = 78 years of age: The upper reference limit for Creatinine is approximately 13% higher for people identified as African-American.      Total Bilirubin 0.5 0.2 - 1.2 mg/dL   Alkaline Phosphatase 44 40 - 115 U/L   AST 13 10 - 35 U/L   ALT 9 9 - 46 U/L   Total Protein 6.7 6.1 - 8.1 g/dL   Albumin 3.7 3.6 - 5.1 g/dL   Calcium 9.5 8.6 - 10.3 mg/dL   GFR, Est African American 59 (L) >=60 mL/min   GFR, Est Non African American 51 (L) >=60 mL/min  Lipid panel     Status: None   Collection Time: 11/20/15  2:09 PM  Result Value Ref Range   Cholesterol 160 125 - 200 mg/dL   Triglycerides 126 <150 mg/dL   HDL 51 >=40 mg/dL   Total CHOL/HDL Ratio 3.1 <=5.0 Ratio   VLDL 25 <30 mg/dL   LDL Cholesterol 84 <130 mg/dL    Comment:   Total Cholesterol/HDL Ratio:CHD Risk                        Coronary Heart Disease Risk Table                                         Men       Women          1/2 Average Risk              3.4        3.3              Average Risk              5.0        4.4           2X Average Risk              9.6        7.1           3X Average Risk             23.4       11.0 Use the calculated Patient Ratio above and the CHD Risk table  to determine the patient's CHD Risk.   TSH     Status: None   Collection Time: 11/20/15  2:09 PM  Result Value Ref Range   TSH 0.96 0.40 - 4.50 mIU/L  B Nat Peptide     Status: Abnormal   Collection Time: 11/20/15  2:09 PM  Result Value Ref Range   Brain Natriuretic Peptide 303.3 (H) <  100 pg/mL    Comment:   BNP levels increase with age in the general population with the highest values seen in individuals greater than 25 years of age. Reference: Joellyn Rued Cardiol 2002; 16:109-60.     Urinalysis     Status: None   Collection Time: 11/20/15  2:09 PM  Result Value Ref Range   Color, Urine CANCELED YELLOW    Comment: The preferred specimen for urinalysis testing is the [employer] urine collection tube. Effective 12/21/2015, [employer], a [employer], will reject any urine cup received that is not transferred into the preferred [employer]R) tube. Using this device, specimen components remain stable in transit up to 72 hours at ambient temperatures. Urine may be collected in a clean, unused cup and transferred to the yellow cap transfer tube. Supply order number is: [id number]. If you have any questions, please contact your [facility] Account Representative directly, or call our Customer Service Department at [phone]. Test not performed, no urine was received.    Result canceled by the ancillary    APPearance CANCELED CLEAR    Comment: Result canceled by the ancillary   Specific Gravity, Urine CANCELED 1.001 - 1.035    Comment: Result canceled by the ancillary   pH CANCELED 5.0 - 8.0    Comment: Result canceled by the ancillary    Glucose, UA CANCELED NEGATIVE    Comment: Result canceled by the ancillary   Bilirubin Urine CANCELED NEGATIVE    Comment: Result canceled by the ancillary   Ketones, ur CANCELED NEGATIVE    Comment: Result canceled by the ancillary   Hgb urine dipstick CANCELED NEGATIVE    Comment: Result canceled by the ancillary   Protein, ur CANCELED NEGATIVE    Comment: Result canceled by the ancillary   Nitrite CANCELED NEGATIVE    Comment: Result canceled by the ancillary   Leukocytes, UA CANCELED NEGATIVE    Comment: Result canceled by the ancillary  Urine Microalbumin w/creat. ratio     Status: None   Collection Time: 11/20/15  2:09 PM  Result Value Ref Range   Creatinine, Urine CANCELED 20 - 370 mg/dL    Comment: Test not performed, no urine was received.    Result canceled by the ancillary    Microalb, Ur CANCELED Not estab mg/dL    Comment: Test not performed, no urine was received.    Result canceled by the ancillary    Microalb Creat Ratio CANCELED <30 mcg/mg creat    Comment: The ADA has defined abnormalities in albumin excretion as follows:           Category           Result                            (mcg/mg creatinine)                 Normal:    <30       Microalbuminuria:    30 - 299   Clinical albuminuria:    > or = 300   The ADA recommends that at least two of three specimens collected within a 3 - 6 month period be abnormal before considering a patient to be within a diagnostic category.    Result canceled by the ancillary      ASSESSMENT/PLAN:   Fall, initial encounter - Plan: Ambulatory referral to Physical Therapy   Patient Instructions  Your  diabetes is under good control. I recommend you stop the Glipizide and Actos. Continue the Metformin.   Next time you come to see Dr Sheppard Coil, please bring all of your pill bottles and any medications you are taking. Please see the list attached, and let us know if any changes need to be made.   We will work on  getting you in to see a lung doctor.   Make sure you are taking your blood pressure medications every day at the same time.     Visit summary with medication list and pertinent instructions was printed for patient to review. All questions at time of visit were answered - patient instructed to contact office with any additional concerns. ER/RTC precautions were reviewed with the patient. Follow-up plan: Return in about 3 months (around 03/18/2016), or sooner if needed, for ROUTINE FOLLOW-UP AND CHECK UP ON MEDICATIONS.

## 2015-12-18 NOTE — Patient Instructions (Addendum)
Your diabetes is under good control. I recommend you stop the Glipizide and Actos. Continue the Metformin.   Next time you come to see Dr Sheppard Coil, please bring all of your pill bottles and any medications you are taking. Please see the list attached, and let us know if any changes need to be made.   We will work on getting you in to see a lung doctor.   Make sure you are taking your blood pressure medications every day at the same time.

## 2015-12-19 ENCOUNTER — Ambulatory Visit: Payer: Self-pay | Admitting: Podiatry

## 2015-12-25 DIAGNOSIS — K5900 Constipation, unspecified: Secondary | ICD-10-CM | POA: Diagnosis not present

## 2015-12-25 DIAGNOSIS — E119 Type 2 diabetes mellitus without complications: Secondary | ICD-10-CM | POA: Diagnosis not present

## 2015-12-25 DIAGNOSIS — I10 Essential (primary) hypertension: Secondary | ICD-10-CM | POA: Diagnosis not present

## 2015-12-25 DIAGNOSIS — C189 Malignant neoplasm of colon, unspecified: Secondary | ICD-10-CM | POA: Diagnosis not present

## 2015-12-30 ENCOUNTER — Ambulatory Visit (INDEPENDENT_AMBULATORY_CARE_PROVIDER_SITE_OTHER): Payer: Medicare Other | Admitting: Podiatry

## 2015-12-30 ENCOUNTER — Encounter: Payer: Self-pay | Admitting: Podiatry

## 2015-12-30 ENCOUNTER — Telehealth: Payer: Self-pay | Admitting: Osteopathic Medicine

## 2015-12-30 VITALS — BP 181/97 | HR 85 | Ht 71.0 in

## 2015-12-30 DIAGNOSIS — M79672 Pain in left foot: Secondary | ICD-10-CM | POA: Diagnosis not present

## 2015-12-30 DIAGNOSIS — M79671 Pain in right foot: Secondary | ICD-10-CM

## 2015-12-30 DIAGNOSIS — M2042 Other hammer toe(s) (acquired), left foot: Secondary | ICD-10-CM

## 2015-12-30 DIAGNOSIS — L97501 Non-pressure chronic ulcer of other part of unspecified foot limited to breakdown of skin: Secondary | ICD-10-CM

## 2015-12-30 DIAGNOSIS — M2041 Other hammer toe(s) (acquired), right foot: Secondary | ICD-10-CM | POA: Diagnosis not present

## 2015-12-30 DIAGNOSIS — I739 Peripheral vascular disease, unspecified: Secondary | ICD-10-CM

## 2015-12-30 DIAGNOSIS — Z8709 Personal history of other diseases of the respiratory system: Secondary | ICD-10-CM

## 2015-12-30 DIAGNOSIS — Z87898 Personal history of other specified conditions: Secondary | ICD-10-CM

## 2015-12-30 NOTE — Patient Instructions (Signed)
Seen for ulcerating corn distal end 3rd left, distal end 1st and 2nd digit right. All debrided. Padded on 3rd left. Pain was relieved. Return in 2 weeks for repeat treatment.

## 2015-12-30 NOTE — Progress Notes (Signed)
SUBJECTIVE: 80 y.o. year old male presents complaining of sore toes on both feet. Patient is referred by Dr. Sheppard Coil.  Patient is wearing hearing aids but still having hard of hearing. Had difficulty communicating with staff.   REVIEW OF SYSTEMS: Pertinent items noted in HPI and remainder of comprehensive ROS otherwise negative.  OBJECTIVE: DERMATOLOGIC EXAMINATION: All nails are thick and yellow but trimmed short. Digital corn with skin breakdown on distal end 3rd left severe, 1st and 2nd right mild.  VASCULAR EXAMINATION OF LOWER LIMBS: Pedal pulses are not palpable on both feet.  Increased Capillary Filling times over 3 seconds in all digits.  Positive of mild  ischemic changes in dorsum of both feet noted.  Temperature gradient from tibial crest to dorsum of foot is within normal bilateral.  NEUROLOGIC EXAMINATION OF THE LOWER LIMBS: Achilles DTR is present and within normal. Monofilament (Semmes-Weinstein 10-gm) sensory testing positive 6 out of 6, bilateral. Vibratory sensations(128Hz  turning fork) intact at medial and lateral forefoot bilateral.  Sharp and Dull discriminatory sensations at the plantar ball of hallux is intact bilateral.   MUSCULOSKELETAL EXAMINATION: Positive for digital contracture with distal skin lesions 3rd left, 1st and 2nd right.  ASSESSMENT: Ulcerating digital corn 3rd left, 1st and 2nd right, limited to breakdown of skin. PVD  PLAN: All lesions debrided. Aperture pad with Amerigel ointment placed on 3rd left. Light dressing applied to 1st and 2nd digit right.  Return in 2 weeks.

## 2015-12-30 NOTE — Telephone Encounter (Signed)
Pt came in and stated he was suppose to be referred to a pulmonary doctor but to check his lungs. He stated he has not heard from anyone about that and he needs to be seen to make sure he is able to have anesthesia for a procedure he needs done. He also wanted me to let you know that last Tuesday he went to the [facility] center and was seen for a bowel blockage. Thanks.

## 2016-01-01 NOTE — Telephone Encounter (Signed)
New order for referral is in, thanks for letting me know.

## 2016-01-01 NOTE — Telephone Encounter (Signed)
Alex Zavala,   I reviewed [name] chart and there is not a referral to Pulmonary if you think he need to see one please place the referral and I will get him an appointment and have them mail the information to him. - CF

## 2016-01-01 NOTE — Telephone Encounter (Signed)
Ok this patient has been ordered multiple referrals, he is very hard of hearing and probably isn't getting messages or hearing phone calls. I think Alex Zavala was in contact with referrals regarding sending him a letter. I'll forward this to [place] - let me knowif anything I can help with

## 2016-01-13 ENCOUNTER — Ambulatory Visit (INDEPENDENT_AMBULATORY_CARE_PROVIDER_SITE_OTHER): Payer: Medicare Other | Admitting: Podiatry

## 2016-01-13 DIAGNOSIS — M2042 Other hammer toe(s) (acquired), left foot: Secondary | ICD-10-CM

## 2016-01-13 DIAGNOSIS — B351 Tinea unguium: Secondary | ICD-10-CM

## 2016-01-13 DIAGNOSIS — L97501 Non-pressure chronic ulcer of other part of unspecified foot limited to breakdown of skin: Secondary | ICD-10-CM

## 2016-01-13 DIAGNOSIS — M2040 Other hammer toe(s) (acquired), unspecified foot: Secondary | ICD-10-CM

## 2016-01-13 DIAGNOSIS — M79671 Pain in right foot: Secondary | ICD-10-CM

## 2016-01-13 DIAGNOSIS — M2041 Other hammer toe(s) (acquired), right foot: Secondary | ICD-10-CM

## 2016-01-13 DIAGNOSIS — M79673 Pain in unspecified foot: Secondary | ICD-10-CM

## 2016-01-13 DIAGNOSIS — M79672 Pain in left foot: Secondary | ICD-10-CM

## 2016-01-13 NOTE — Patient Instructions (Signed)
Seen for painful corn and ingrown nail. All nails debrided. Debrided painful corn on 3rd digit left and a buttress pad placed. Keep the pad during the day and remove at night. Return in one month or sooner if needed.

## 2016-01-13 NOTE — Progress Notes (Signed)
SUBJECTIVE: 80 y.o. year old male presents for follow up on painful toe 3rd left. Also has sore toe on right great toe with ingrown nail. Patient is hard of hearing.   OBJECTIVE: DERMATOLOGIC EXAMINATION: All nails are thick and yellow with mildly inflamed nail border on right great toe. Digital corn with skin breakdown on distal end 3rd left with minimum skin breakdown.  VASCULAR EXAMINATION OF LOWER LIMBS: Pedal pulses are not palpable on both feet.  Positive of mild  ischemic changes in dorsum of both feet noted.  Temperature gradient from tibial crest to dorsum of foot is within normal bilateral.  NEUROLOGIC EXAMINATION OF THE LOWER LIMBS: All epicritic and tactile sensations grossly intact.  MUSCULOSKELETAL EXAMINATION: Positive for digital contracture with distal skin lesions 3rd left, 1st and 2nd right.  ASSESSMENT: Ulcerating digital corn 3rd left, 1st and 2nd right, limited to breakdown of skin. Contracted digit 3rd left, 1st and 2nd right. PVD  PLAN: All lesions debrided. Buttress pad with Amerigel ointment dressing placed on 3rd left with instruction to remove the pad at night and use it while wearing shoes. All nails debrided. Return in one month.

## 2016-01-14 ENCOUNTER — Encounter: Payer: Self-pay | Admitting: Podiatry

## 2016-01-15 DIAGNOSIS — R350 Frequency of micturition: Secondary | ICD-10-CM | POA: Diagnosis not present

## 2016-01-15 DIAGNOSIS — R3915 Urgency of urination: Secondary | ICD-10-CM | POA: Diagnosis not present

## 2016-01-15 DIAGNOSIS — R3916 Straining to void: Secondary | ICD-10-CM | POA: Diagnosis not present

## 2016-01-17 ENCOUNTER — Encounter: Payer: Self-pay | Admitting: Osteopathic Medicine

## 2016-01-17 DIAGNOSIS — R39198 Other difficulties with micturition: Secondary | ICD-10-CM | POA: Insufficient documentation

## 2016-01-17 DIAGNOSIS — Z8546 Personal history of malignant neoplasm of prostate: Secondary | ICD-10-CM | POA: Insufficient documentation

## 2016-02-03 ENCOUNTER — Ambulatory Visit (INDEPENDENT_AMBULATORY_CARE_PROVIDER_SITE_OTHER): Payer: Medicare Other

## 2016-02-03 ENCOUNTER — Encounter: Payer: Self-pay | Admitting: Osteopathic Medicine

## 2016-02-03 ENCOUNTER — Ambulatory Visit (INDEPENDENT_AMBULATORY_CARE_PROVIDER_SITE_OTHER): Payer: Medicare Other | Admitting: Osteopathic Medicine

## 2016-02-03 ENCOUNTER — Ambulatory Visit: Payer: Medicare Other

## 2016-02-03 VITALS — BP 177/88 | HR 67 | Wt 144.0 lb

## 2016-02-03 DIAGNOSIS — M25561 Pain in right knee: Secondary | ICD-10-CM | POA: Diagnosis not present

## 2016-02-03 DIAGNOSIS — M25551 Pain in right hip: Secondary | ICD-10-CM

## 2016-02-03 DIAGNOSIS — W19XXXA Unspecified fall, initial encounter: Secondary | ICD-10-CM | POA: Diagnosis not present

## 2016-02-03 DIAGNOSIS — I70208 Unspecified atherosclerosis of native arteries of extremities, other extremity: Secondary | ICD-10-CM

## 2016-02-03 DIAGNOSIS — M25461 Effusion, right knee: Secondary | ICD-10-CM

## 2016-02-03 DIAGNOSIS — I7090 Unspecified atherosclerosis: Secondary | ICD-10-CM

## 2016-02-03 DIAGNOSIS — M542 Cervicalgia: Secondary | ICD-10-CM | POA: Diagnosis not present

## 2016-02-03 DIAGNOSIS — I10 Essential (primary) hypertension: Secondary | ICD-10-CM

## 2016-02-03 DIAGNOSIS — S199XXA Unspecified injury of neck, initial encounter: Secondary | ICD-10-CM | POA: Diagnosis not present

## 2016-02-03 DIAGNOSIS — I708 Atherosclerosis of other arteries: Secondary | ICD-10-CM

## 2016-02-03 NOTE — Patient Instructions (Signed)
Fall Prevention in the Home  Falls can cause injuries and can affect people from all age groups. There are many simple things that you can do to make your home safe and to help prevent falls. WHAT CAN I DO ON THE OUTSIDE OF MY HOME?  Regularly repair the edges of walkways and driveways and fix any cracks.  Remove high doorway thresholds.  Trim any shrubbery on the main path into your home.  Use bright outdoor lighting.  Clear walkways of debris and clutter, including tools and rocks.  Regularly check that handrails are securely fastened and in good repair. Both sides of any steps should have handrails.  Install guardrails along the edges of any raised decks or porches.  Have leaves, snow, and ice cleared regularly.  Use sand or salt on walkways during winter months.  In the garage, clean up any spills right away, including grease or oil spills. WHAT CAN I DO IN THE BATHROOM?  Use night lights.  Install grab bars by the toilet and in the tub and shower. Do not use towel bars as grab bars.  Use non-skid mats or decals on the floor of the tub or shower.  If you need to sit down while you are in the shower, use a plastic, non-slip stool..  Keep the floor dry. Immediately clean up any water that spills on the floor.  Remove soap buildup in the tub or shower on a regular basis.  Attach bath mats securely with double-sided non-slip rug tape.  Remove throw rugs and other tripping hazards from the floor. WHAT CAN I DO IN THE BEDROOM?  Use night lights.  Make sure that a bedside light is easy to reach.  Do not use oversized bedding that drapes onto the floor.  Have a firm chair that has side arms to use for getting dressed.  Remove throw rugs and other tripping hazards from the floor. WHAT CAN I DO IN THE KITCHEN?   Clean up any spills right away.  Avoid walking on wet floors.  Place frequently used items in easy-to-reach places.  If you need to reach for something  above you, use a sturdy step stool that has a grab bar.  Keep electrical cables out of the way.  Do not use floor polish or wax that makes floors slippery. If you have to use wax, make sure that it is non-skid floor wax.  Remove throw rugs and other tripping hazards from the floor. WHAT CAN I DO IN THE STAIRWAYS?  Do not leave any items on the stairs.  Make sure that there are handrails on both sides of the stairs. Fix handrails that are broken or loose. Make sure that handrails are as long as the stairways.  Check any carpeting to make sure that it is firmly attached to the stairs. Fix any carpet that is loose or worn.  Avoid having throw rugs at the top or bottom of stairways, or secure the rugs with carpet tape to prevent them from moving.  Make sure that you have a light switch at the top of the stairs and the bottom of the stairs. If you do not have them, have them installed. WHAT ARE SOME OTHER FALL PREVENTION TIPS?  Wear closed-toe shoes that fit well and support your feet. Wear shoes that have rubber soles or low heels.  When you use a stepladder, make sure that it is completely opened and that the sides are firmly locked. Have someone hold the ladder while you   are using it. Do not climb a closed stepladder.  Add color or contrast paint or tape to grab bars and handrails in your home. Place contrasting color strips on the first and last steps.  Use mobility aids as needed, such as canes, walkers, scooters, and crutches.  Turn on lights if it is dark. Replace any light bulbs that burn out.  Set up furniture so that there are clear paths. Keep the furniture in the same spot.  Fix any uneven floor surfaces.  Choose a carpet design that does not hide the edge of steps of a stairway.  Be aware of any and all pets.  Review your medicines with your healthcare provider. Some medicines can cause dizziness or changes in blood pressure, which increase your risk of falling. Talk  with your health care provider about other ways that you can decrease your risk of falls. This may include working with a physical therapist or trainer to improve your strength, balance, and endurance.   This information is not intended to replace advice given to you by your health care provider. Make sure you discuss any questions you have with your health care provider.   Document Released: 03/06/2002 Document Revised: 07/31/2014 Document Reviewed: 04/20/2014 Elsevier Interactive Patient Education 2016 [employer]  

## 2016-02-03 NOTE — Progress Notes (Signed)
HPI: Alex Zavala is a 80 y.o. male  who presents to [place] today, 02/03/16,  for chief complaint of:  Chief Complaint  Patient presents with  . Hip Pain  . Knee Pain    Fall at home several days ago. Multiple falls in the past year, pt declines PT due to cost. He is ambulating with cane. C/o R knee pain with slight abrasion, R hip and lower back pain, and neck soreness. Denies head trauma. No LOC. Tripped in kitchen. Has Meloxicam at home as well as Tylenol #3 but hasn't tried this.     Past medical, surgical, social and family history reviewed: Past Medical History:  Diagnosis Date  . Diabetes ([place])   . Heart disease   . Hyperlipidemia   . Hypertension    Past Surgical History:  Procedure Laterality Date  . CHOLECYSTECTOMY    . RETROPUBIC PROSTATECTOMY     Social History  Substance Use Topics  . Smoking status: Former Research scientist (life sciences)  . Smokeless tobacco: Never Used     Comment: quit smoking 50 years ago  . Alcohol use No   Family History  Problem Relation Age of Onset  . Hyperlipidemia    . Hypertension       Current medication list and allergy/intolerance information reviewed:   Current Outpatient Prescriptions on File Prior to Visit  Medication Sig Dispense Refill  . acetaminophen-codeine (TYLENOL #3) 300-30 MG tablet Take 1 tablet by mouth every 4 (four) hours as needed for moderate pain or severe pain. 30 tablet 0  . AMBULATORY NON FORMULARY MEDICATION Glucometer per insurance formulary.  Glucometer test strips, use to check blood sugar daily.  Dx: Type 2 Diabetes 50 Units 11  . aspirin 81 MG tablet Take 81 mg by mouth daily.    . clotrimazole-betamethasone (LOTRISONE) cream APPLY TO AFFECTED AREA TWICE A DAY FOR TWO WEEKS, MAY REQUIRE FOUR WEEKS IF INVOLVING THE FEET/TOES. 90 g 6  . enalapril (VASOTEC) 20 MG tablet Take one and half tablet by mouth daily. 135 tablet 1  . fenofibrate (TRICOR) 48 MG tablet TAKE 1 TABLET BY MOUTH  DAILY. 30 tablet 5  . hydrochlorothiazide (HYDRODIURIL) 25 MG tablet One tablet by mouth every morning for blood pressure control. 30 tablet 2  . lovastatin (MEVACOR) 40 MG tablet Take 2 tablets (80 mg total) by mouth daily. 60 tablet 5  . meloxicam (MOBIC) 15 MG tablet One by mouth daily with a meal as needed for pain. 30 tablet 3  . metFORMIN (GLUCOPHAGE) 1000 MG tablet One by mouth twice a day for blood sugar control. 180 tablet 1  . metoprolol (LOPRESSOR) 100 MG tablet TAKE 2 TABLETs (200 MG TOTAL) BY MOUTH TWICE A DAY 180 tablet 1  . nitroGLYCERIN (NITROSTAT) 0.4 MG SL tablet Place 1 tablet (0.4 mg total) under the tongue every 5 (five) minutes as needed for chest pain. 50 tablet 3  . pantoprazole (PROTONIX) 40 MG tablet TAKE 1 TABLET BY MOUTH DAILY. 30 tablet 1  . ranolazine (RANEXA) 500 MG 12 hr tablet Take 1 tablet (500 mg total) by mouth 2 (two) times daily. 180 tablet 1   No current facility-administered medications on file prior to visit.    Allergies  Allergen Reactions  . Invokana [Canagliflozin]   . Januvia [Sitagliptin] Other (See Comments)    Abdominal pain      Review of Systems:  Cardiac: No  chest pain  Respiratory:  No trouble breathing  Gastrointestinal: No  abdominal pain  Musculoskeletal: +new myalgia/arthralgia  Skin: +knee abrasion  Neurologic: +generalized weakness, No  Dizziness   Exam:  BP (!) 177/88   Pulse 67   Wt 144 lb (65.3 kg)   BMI 20.08 kg/m   Constitutional: VS see above. General Appearance: alert, well-developed, well-nourished, NAD  Eyes: Normal lids and conjunctive, non-icteric sclera  Ears, Nose, Mouth, Throat: MMM, Normal external inspection ears/nares/mouth/lips/gums. Very hard of hearing. Hearing aids in place  Neck: No masses, trachea midline. No spinal midline tenderness, ROM symmetric  Respiratory: Normal respiratory effort  Cardiovascular: S1/S2 normal, RRR.   Musculoskeletal: Gait normal w/ cane. Symmetric and  independent movement of all extremities. R Knee (+)crepitus, no ligamentous instability to ACL/PCL or MCL/LCL, slight abrasion to skin, healing normally. R hip/low back (+)paraspinal tenderness, no tenderness to trochanter  Neurological: grossly intact, slow movements, no shuffling gait   Skin: warm, dry, intact except small abrasion on R knee  Psychiatric: Normal judgment/insight. Normal mood and affect. Oriented x3.     X-rays personally reviewed, I do not see any fracture, there are changes consistent with arthritis and atherosclerosis, radiology over-read as below  Dg Cervical Spine Complete  Result Date: 02/03/2016 CLINICAL DATA:  Persistent pain after fall 1 week prior EXAM: CERVICAL SPINE - COMPLETE 4+ VIEW COMPARISON:  Aug 27, 2014 FINDINGS: Frontal, lateral, open-mouth odontoid, and bilateral oblique views were obtained. There is no demonstrable fracture or spondylolisthesis. Prevertebral soft tissues and predental space regions are normal. There is moderate disc space narrowing at C4-5 and C6-7, particularly along the anterior aspects of these discs. There is mild disc space narrowing at C2-3 and C3-4. There is facet osteoarthritic change with exit foraminal narrowing to varying degrees at all levels bilaterally. There is nuchal ligament calcification posteriorly in the inferior cervical spine region, also present previously. There is reversal of lordotic curvature. IMPRESSION: There is no demonstrable acute fracture or spondylolisthesis. There is multilevel osteoarthritic change. There is probable muscle spasm given the reversal of the lordotic curvature. Electronically Signed   By: Lowella Grip III M.D.   On: 02/03/2016 10:52   Dg Knee Complete 4 Views Right  Result Date: 02/03/2016 CLINICAL DATA:  Pain following fall 1 month prior EXAM: RIGHT KNEE - COMPLETE 4+ VIEW COMPARISON:  None. FINDINGS: Frontal, lateral, and bilateral oblique views were obtained. There is no fracture or  dislocation. There is a small joint effusion. There is mild patellofemoral joint space narrowing. Other joint spaces appear unremarkable. There are prominent osteophytes along the anterior patella and to a much lesser degree along the posterior patella. There is extensive calcification in the distal superficial femoral artery, popliteal artery, and proximal trifurcation arteries. IMPRESSION: Mild osteoarthritic change. Small joint effusion. No acute fracture or dislocation. Widespread arterial vascular calcifications/ atherosclerosis. Electronically Signed   By: Lowella Grip III M.D.   On: 02/03/2016 10:55   Dg Hip Unilat W Or W/o Pelvis 2-3 Views Right  Result Date: 02/03/2016 CLINICAL DATA:  Pain following fall 1 week prior EXAM: DG HIP (WITH OR WITHOUT PELVIS) 2-3V RIGHT COMPARISON:  April 05, 2013 FINDINGS: Frontal pelvis as well as frontal and lateral right hip images were obtained. There is no acute fracture or dislocation. There is mild symmetric narrowing of both hip joints. No erosive change. There is postoperative change in the lower pelvis. There is extensive arterial vascular calcification at multiple sites. IMPRESSION: Symmetric narrowing both hip joints. No acute fracture dislocation. Extensive arterial vascular calcifications/ atherosclerosis involving multiple sites. Electronically Signed  By: Lowella Grip III M.D.   On: 02/03/2016 10:54      ASSESSMENT/PLAN: What I think would really benefit patient is physical therapy, however he states he will not go to this due to cost. States pain medications were not helpful, I advised that would still try, particularly for the knee, rest and ice as well as elevation of the affected limb. Would take the meloxicam for at least a few days to hopefully decrease some of the inflammation, he should have Tylenol 3 leftover as well at home. Counseled patient that realistically we may not be able to get pain to 0 but medications should help decrease  it somewhat. Recheck blood pressure at next visit. Follow-up 6 weeks, after referrals, we have been trying to get patient into GI to follow-up for history of colon cancer, and pulmonology to follow-up for lung nodules. Patient is very hard of hearing and this is an obstacle to setting up appointments, we have asked his specialist to send letters rather than call the patient.  Pain of right hip joint - Plan: DG HIP UNILAT W OR W/O PELVIS 2-3 VIEWS RIGHT  Acute pain of right knee - Plan: DG Knee Complete 4 Views Right  Neck pain - Plan: DG Cervical Spine Complete, CANCELED: DG Cervical Spine 2 or 3 views  Fall, initial encounter  Essential hypertension, benign  Arterial atherosclerosis    Patient instructions printed for fall prevention.   Visit summary with medication list and pertinent instructions was printed for patient to review. All questions at time of visit were answered - patient instructed to contact office with any additional concerns. ER/RTC precautions were reviewed with the patient. Follow-up plan: Return in about 6 weeks (around 03/16/2016) for follow-up after referrals .

## 2016-02-10 ENCOUNTER — Ambulatory Visit: Payer: Self-pay | Admitting: Podiatry

## 2016-02-11 ENCOUNTER — Other Ambulatory Visit: Payer: Self-pay

## 2016-02-11 ENCOUNTER — Institutional Professional Consult (permissible substitution): Payer: Self-pay | Admitting: Internal Medicine

## 2016-02-11 DIAGNOSIS — I2510 Atherosclerotic heart disease of native coronary artery without angina pectoris: Secondary | ICD-10-CM

## 2016-02-11 MED ORDER — ACETAMINOPHEN-CODEINE #3 300-30 MG PO TABS: 1.0000 | ORAL_TABLET | ORAL | 0 refills | Status: DC | PRN

## 2016-02-11 MED ORDER — RANOLAZINE ER 500 MG PO TB12: 500.0000 mg | ORAL_TABLET | Freq: Two times a day (BID) | ORAL | 1 refills | Status: AC

## 2016-02-11 NOTE — Telephone Encounter (Addendum)
[facility] sent a refill request for acetaminophen-cod # 3 take one by mouth every 4 hours as needed for pain. How many refills will he need?  Also they sent a request for Ranexa Er 500 mg one tablet twice daily for a 90 day refill. Last prescribed by Dr Ileene Rubens.

## 2016-03-12 ENCOUNTER — Ambulatory Visit (INDEPENDENT_AMBULATORY_CARE_PROVIDER_SITE_OTHER): Payer: Medicare Other | Admitting: Internal Medicine

## 2016-03-12 ENCOUNTER — Encounter: Payer: Self-pay | Admitting: Internal Medicine

## 2016-03-12 VITALS — BP 124/76 | HR 90 | Ht 71.0 in | Wt 141.8 lb

## 2016-03-12 DIAGNOSIS — I10 Essential (primary) hypertension: Secondary | ICD-10-CM

## 2016-03-12 DIAGNOSIS — R05 Cough: Secondary | ICD-10-CM | POA: Insufficient documentation

## 2016-03-12 DIAGNOSIS — R058 Other specified cough: Secondary | ICD-10-CM | POA: Insufficient documentation

## 2016-03-12 MED ORDER — VALSARTAN 160 MG PO TABS: 160.0000 mg | ORAL_TABLET | Freq: Every day | ORAL | 11 refills | Status: AC

## 2016-03-12 NOTE — Patient Instructions (Addendum)
Stop vasotec and start valsartan 160 mg one daily   Pantoprazole (protonix) 40 mg   Take  30-60 min before first meal of the day and Pepcid (famotidine)  20 mg one @  bedtime until return to office - this is the best way to tell whether stomach acid is contributing to your problem.    GERD (REFLUX)  is an extremely common cause of respiratory symptoms just like yours , many times with no obvious heartburn at all.    It can be treated with medication, but also with lifestyle changes including elevation of the head of your bed (ideally with 6 inch  bed blocks),  Smoking cessation, avoidance of late meals, excessive alcohol, and avoid fatty foods, chocolate, peppermint, colas, red wine, and acidic juices such as orange juice.  NO MINT OR MENTHOL PRODUCTS SO NO COUGH DROPS  USE SUGARLESS CANDY INSTEAD (Jolley ranchers or Stover's or Life Savers) or even ice chips will also do - the key is to swallow to prevent all throat clearing. NO OIL BASED VITAMINS - use powdered substitutes.    If not better in 6 weeks please return with all active medications in hand and a friend or family member to go over additional steps in more detail than we did today

## 2016-03-12 NOTE — Progress Notes (Signed)
Subjective:     Patient ID: Alex Zavala, male   DOB: Sep 09, 1935,     MRN: [id number]  HPI  63 yowm quit smoking 1958 s resp problems with new cough onset 2016 referred to pulmonary clinic 03/12/2016 by [facility] at [facility] Dr Sheppard Coil    03/12/2016 1st [facility] office visit/ Alex Zavala   Chief Complaint  Patient presents with  . Pulmonary Consult    Referred by Emeterio Reeve.  Pt c/o "hacking cough" for the past couple of years. He had some hemoptysis a few days ago.    extremely difficult hx and not sure pt understood any of the questions asked but drove himself to the clinic and no fm available to help him at home either apparently and no hx in EPIC to confirm onset or duration/ pattern of his cough which is mostly dry - could not confirm he ever coughed up signicant blood  As far as I could ascertain from his hx and written consult sheet >> No obvious day to day or daytime variability or assoc sob or mucus plugs or  cp or chest tightness, subjective wheeze or overt sinus or hb symptoms. No unusual exp hx or h/o childhood pna/ asthma or knowledge of premature birth.  Sleeping ok without nocturnal  or early am exacerbation  of respiratory  c/o's or need for noct saba. Also denies any obvious fluctuation of symptoms with weather or environmental changes or other aggravating or alleviating factors except as outlined above   Current Medications(could not be confirmed but include vasotec on EPIC list) , Allergies, Complete Past Medical History, Past Surgical History, Family History, and Social History were reviewed in [employer] record.  ROS  The following are not active complaints unless bolded sore throat, dysphagia, dental problems, itching, sneezing,  nasal congestion or excess/ purulent secretions, ear ache,   fever, chills, sweats, unintended wt loss, classically pleuritic or exertional cp,  orthopnea pnd or leg swelling, presyncope, palpitations,  abdominal pain, anorexia, nausea, vomiting, diarrhea  or change in bowel or bladder habits, change in stools or urine, dysuria,hematuria,  rash, arthralgias, visual complaints, headache, numbness, weakness or ataxia or problems with walking or coordination,  change in mood/affect or memory.             Review of Systems     Objective:   Physical Exam    Very hoarse to the point of not being understandable  amb thin frail  wm nad   Wt Readings from Last 3 Encounters:  03/12/16 141 lb 12.8 oz (64.3 kg)  02/03/16 144 lb (65.3 kg)  12/18/15 144 lb (65.3 kg)    Vital signs reviewed - Note on arrival 02 sats  100% on RA     HEENT: nl dentition, turbinates, and oropharynx. Nl external ear canals without cough reflex   NECK :  without JVD/Nodes/TM/ nl carotid upstrokes bilaterally   LUNGS: no acc muscle use,  Nl contour chest which is clear to A and P bilaterally without cough on insp or exp maneuvers   CV:  RRR  no s3 or murmur or increase in P2, nad no edema   ABD:  soft and nontender with nl inspiratory excursion in the supine position. No bruits or organomegaly appreciated, bowel sounds nl  MS:  Nl gait/ ext warm without deformities, calf tenderness, cyanosis or clubbing No obvious joint restrictions   SKIN: warm and dry without lesions    NEURO:  alert, approp, nl sensorium  with  no motor or cerebellar deficits apparent.     I personally reviewed images and agree with radiology impression as follows:  CXR:   10/07/15 (for cough) 1. No acute cardiopulmonary abnormality. Specifically there is no evidence of pneumonia.    Assessment:

## 2016-03-13 ENCOUNTER — Other Ambulatory Visit: Payer: Self-pay

## 2016-03-13 MED ORDER — ACETAMINOPHEN-CODEINE #3 300-30 MG PO TABS: 1.0000 | ORAL_TABLET | ORAL | 0 refills | Status: DC | PRN

## 2016-03-13 NOTE — Assessment & Plan Note (Addendum)
In the best review of chronic cough to date ( NEJM 2016 375 [id number]) ,  ACEi are now felt to cause cough in up to  20% of pts which is a 4 fold increase from previous reports and does not include the variety of non-specific complaints we see in pulmonary clinic in pts on ACEi but previously attributed to another dx like  Copd/asthma and  include PNDS, throat and chest congestion, "bronchitis", unexplained dyspnea and noct "strangling" sensations, and hoarseness, but also  atypical /refractory GERD symptoms like dysphagia and "bad heartburn"   The only way I know  to prove this is not an "ACEi Case" is a trial off ACEi x a minimum of 6 weeks then regroup if not all better  Try diovan 160 mg daily > Follow up per Primary Care planned

## 2016-03-13 NOTE — Assessment & Plan Note (Addendum)
The most common causes of chronic cough in immunocompetent adults include the following: upper airway cough syndrome (UACS), previously referred to as postnasal drip syndrome (PNDS), which is caused by variety of rhinosinus conditions; (2) asthma; (3) GERD; (4) chronic bronchitis from cigarette smoking or other inhaled environmental irritants; (5) nonasthmatic eosinophilic bronchitis; and (6) bronchiectasis.   These conditions, singly or in combination, have accounted for up to 94% of the causes of chronic cough in prospective studies.   Other conditions have constituted no >6% of the causes in prospective studies These have included bronchogenic carcinoma, chronic interstitial pneumonia, sarcoidosis, left ventricular failure, ACEI-induced cough, and aspiration from a condition associated with pharyngeal dysfunction.    Chronic cough is often simultaneously caused by more than one condition. A single cause has been found from 38 to 82% of the time, multiple causes from 18 to 62%. Multiply caused cough has been the result of three diseases up to 42% of the time.       Most likely this is Upper airway cough syndrome (previously labeled PNDS) , is  so named because it's frequently impossible to sort out how much is  CR/sinusitis with freq throat clearing (which can be related to primary GERD)   vs  causing  secondary (" extra esophageal")  GERD from wide swings in gastric pressure that occur with throat clearing, often  promoting self use of mint and menthol lozenges that reduce the lower esophageal sphincter tone and exacerbate the problem further in a cyclical fashion.   These are the same pts (now being labeled as having "irritable larynx syndrome" by some cough centers) who not infrequently have a history of having failed to tolerate ace inhibitors,  dry powder inhalers or biphosphonates or report having atypical/extraesophageal reflux symptoms that don't respond to standard doses of PPI  and are easily  confused as having aecopd or asthma flares by even experienced allergists/ pulmonologists (myself included).   First step is therefore trial off ACEi and reinforce rx for GERD (not clear he was taking but add pepcid also at hs to get 24 h acid suppression/diet info given) and  return in 6 weeks if not better to his satisfaction but needs to bring   fm member. with all meds in hand using a trust but verify approach to confirm accurate Medication  Reconciliation The principal here is that until we are certain that the  patients are doing what we've asked, it makes no sense to ask them to do more.   Total time devoted to counseling  > 50 % of office visit:  review case with pt/ discussion of options/alternatives/ personally creating written customized instructions  in presence of pt  then going over those specific  Instructions directly with the pt including how to use all of the meds but in particular covering each new medication in detail and the difference between the maintenance/automatic meds and the prns using an action plan format for the latter.  Please see AVS from this visit for a full list of these instructions

## 2016-03-18 ENCOUNTER — Ambulatory Visit: Payer: Self-pay | Admitting: Osteopathic Medicine

## 2016-03-18 ENCOUNTER — Other Ambulatory Visit: Payer: Self-pay | Admitting: Osteopathic Medicine

## 2016-03-18 MED ORDER — ACETAMINOPHEN-CODEINE #3 300-30 MG PO TABS: 1.0000 | ORAL_TABLET | ORAL | 0 refills | Status: AC | PRN

## 2016-04-03 ENCOUNTER — Other Ambulatory Visit: Payer: Self-pay | Admitting: Osteopathic Medicine

## 2016-04-13 ENCOUNTER — Ambulatory Visit: Payer: Self-pay | Admitting: Osteopathic Medicine

## 2016-04-14 ENCOUNTER — Ambulatory Visit (INDEPENDENT_AMBULATORY_CARE_PROVIDER_SITE_OTHER): Payer: Medicare Other | Admitting: Osteopathic Medicine

## 2016-04-14 ENCOUNTER — Encounter: Payer: Self-pay | Admitting: Osteopathic Medicine

## 2016-04-14 VITALS — BP 144/72 | HR 66 | Wt 151.0 lb

## 2016-04-14 DIAGNOSIS — H903 Sensorineural hearing loss, bilateral: Secondary | ICD-10-CM

## 2016-04-14 DIAGNOSIS — Z9181 History of falling: Secondary | ICD-10-CM

## 2016-04-14 DIAGNOSIS — I10 Essential (primary) hypertension: Secondary | ICD-10-CM | POA: Diagnosis not present

## 2016-04-14 DIAGNOSIS — E1121 Type 2 diabetes mellitus with diabetic nephropathy: Secondary | ICD-10-CM

## 2016-04-14 DIAGNOSIS — W19XXXA Unspecified fall, initial encounter: Secondary | ICD-10-CM

## 2016-04-14 DIAGNOSIS — Z9189 Other specified personal risk factors, not elsewhere classified: Secondary | ICD-10-CM

## 2016-04-14 DIAGNOSIS — Y92009 Unspecified place in unspecified non-institutional (private) residence as the place of occurrence of the external cause: Secondary | ICD-10-CM

## 2016-04-14 DIAGNOSIS — M4722 Other spondylosis with radiculopathy, cervical region: Secondary | ICD-10-CM | POA: Diagnosis not present

## 2016-04-14 DIAGNOSIS — Y92099 Unspecified place in other non-institutional residence as the place of occurrence of the external cause: Secondary | ICD-10-CM

## 2016-04-14 NOTE — Patient Instructions (Signed)
Plan:  For diabetes: Are machine in the lab was not able to measure your sugars. We are sending you down to the lab for blood draw to confirm these levels. Used to get a letter about these results.  For blood pressure: Your blood pressure is in a good range today. Continue current medications.  For falling: I have sent a referral for home health to have a physical therapist come out to your home for an evaluation and to help with balance training. I also recommend that you purchased a 4-footed cane to help with balance/stability.  For your feet: You are overdue to follow-up with podiatry downstairs, I recommend that you schedule a follow-up with them as soon as possible for foot care.

## 2016-04-14 NOTE — Progress Notes (Signed)
HPI: Alex Zavala is a 81 y.o. male  who presents to [place] today, 04/14/16,  for chief complaint of:  Chief Complaint  Patient presents with  . Follow-up    DIABETES   Patient is significantly hard of hearing and I believe this is affecting his speech to see his very difficult to understand. We have tried typing out messages for him, questionable literacy as well, see note for full details.   DM2: Not sure if he is taking his medications, A1c point-of-care not able to read, stating that is too low. Patient denies hypoglycemic episodes but is having some concerning falls  HTN: Blood pressure is improved today, no chest pain  Foot problem: need to follow-up with podiatry based on their last OV note 12/2015 was d/t f/u 1 mo. patient has no foot complaints at this point  Falling: thinks due to herniated disc in the neck or hearing problems. I typed out a message for him asking specifically if he is tripping over things or if he is feeling dizzy. He looks like he reads the message but then just complains about knee pain, does not answer the question of whether or not he gets dizzy. Patient has cane with him at this visit, regular cane not a 4 pronged one. Neck pain improves with Mobic    Past medical, surgical, social and family history reviewed: Patient Active Problem List   Diagnosis Date Noted  . Upper airway cough syndrome 03/12/2016  . Arterial atherosclerosis 02/03/2016  . History of prostate cancer 01/17/2016  . Difficulty urinating 01/17/2016  . Laceration of head 11/05/2015  . Type 2 diabetes with nephropathy ([place]) 07/15/2015  . Scalp pain 01/22/2015  . Left shoulder pain 10/08/2014  . Cervical spondylosis with radiculopathy 09/10/2014  . Lumbago 07/18/2014  . Osteoarthritis of left hip 10/04/2013  . Microscopic hematuria 08/22/2013  . Hemoptysis 12/12/2012  . GERD (gastroesophageal reflux disease) 12/05/2012  . Microalbuminuria  12/05/2012  . History of colon cancer, stage II 12/05/2012  . Family history of prostate cancer 12/05/2012  . Hyperlipidemia LDL goal < 70 11/30/2012  . Essential hypertension, benign 11/30/2012  . Skin lesion of back 11/30/2012  . Cough 11/30/2012  . Coronary atherosclerosis 04/12/2012  . Perceptive hearing loss, both sides 04/12/2012   Past Surgical History:  Procedure Laterality Date  . CHOLECYSTECTOMY    . RETROPUBIC PROSTATECTOMY     Social History  Substance Use Topics  . Smoking status: Former Smoker    Packs/day: 1.00    Years: 15.00    Types: Cigarettes    Quit date: 03/30/1956  . Smokeless tobacco: Never Used     Comment: quit smoking 50 years ago  . Alcohol use No   Family History  Problem Relation Age of Onset  . Hyperlipidemia    . Hypertension       Current medication list and allergy/intolerance information reviewed:   Current Outpatient Prescriptions on File Prior to Visit  Medication Sig Dispense Refill  . acetaminophen-codeine (TYLENOL #3) 300-30 MG tablet Take 1 tablet by mouth every 4 (four) hours as needed for moderate pain or severe pain (use sparingly to avoid tolerance/dependence). #60 for 30 days 60 tablet 0  . AMBULATORY NON FORMULARY MEDICATION Glucometer per insurance formulary.  Glucometer test strips, use to check blood sugar daily.  Dx: Type 2 Diabetes (Patient not taking: Reported on 03/12/2016) 50 Units 11  . aspirin 81 MG tablet Take 81 mg by mouth daily.    Marland Kitchen  clotrimazole-betamethasone (LOTRISONE) cream APPLY TO AFFECTED AREA TWICE A DAY FOR TWO WEEKS, MAY REQUIRE FOUR WEEKS IF INVOLVING THE FEET/TOES. (Patient not taking: Reported on 03/12/2016) 90 g 6  . fenofibrate (TRICOR) 48 MG tablet TAKE 1 TABLET BY MOUTH DAILY. (Patient not taking: Reported on 03/12/2016) 30 tablet 5  . hydrochlorothiazide (HYDRODIURIL) 25 MG tablet One tablet by mouth every morning for blood pressure control. (Patient not taking: Reported on 03/12/2016) 30 tablet 2  .  lovastatin (MEVACOR) 40 MG tablet Take 2 tablets (80 mg total) by mouth daily. (Patient not taking: Reported on 03/12/2016) 60 tablet 5  . meloxicam (MOBIC) 15 MG tablet One by mouth daily with a meal as needed for pain. (Patient not taking: Reported on 03/12/2016) 30 tablet 3  . metFORMIN (GLUCOPHAGE) 1000 MG tablet One by mouth twice a day for blood sugar control. (Patient not taking: Reported on 03/12/2016) 180 tablet 1  . metoprolol (LOPRESSOR) 100 MG tablet TAKE 2 TABLETs (200 MG TOTAL) BY MOUTH TWICE A DAY (Patient not taking: Reported on 03/12/2016) 180 tablet 1  . nitroGLYCERIN (NITROSTAT) 0.4 MG SL tablet Place 1 tablet (0.4 mg total) under the tongue every 5 (five) minutes as needed for chest pain. (Patient not taking: Reported on 03/12/2016) 50 tablet 3  . pantoprazole (PROTONIX) 40 MG tablet TAKE 1 TABLET BY MOUTH DAILY. 30 tablet 1  . ranolazine (RANEXA) 500 MG 12 hr tablet Take 1 tablet (500 mg total) by mouth 2 (two) times daily. (Patient not taking: Reported on 03/12/2016) 180 tablet 1  . valsartan (DIOVAN) 160 MG tablet Take 1 tablet (160 mg total) by mouth daily. 30 tablet 11   No current facility-administered medications on file prior to visit.    Allergies  Allergen Reactions  . Invokana [Canagliflozin]   . Januvia [Sitagliptin] Other (See Comments)    Abdominal pain      Review of Systems:  Constitutional: No recent illness  HEENT: No  headache  Cardiac: No  chest pain  Respiratory:  No  shortness of breath  Musculoskeletal: No new myalgia/arthralgia  Neurologic: Reports no Dizziness   Exam:  BP (!) 144/72   Pulse 66   Wt 151 lb (68.5 kg)   BMI 21.06 kg/m   Constitutional: VS see above. General Appearance: alert, well-developed, well-nourished, NAD  Eyes: Normal lids and conjunctive, non-icteric sclera  Ears, Nose, Mouth, Throat: MMM, Normal external inspection ears/nares/mouth/lips/gums. Poor dentition  Neck: No masses, trachea midline.    Respiratory: Fair respiratory effort. no wheeze, no rhonchi, no rales  Cardiovascular: S1/S2 normal, no murmur, no rub/gallop auscultated. RRR.   Musculoskeletal: Gait normal. Symmetric and independent movement of all extremities. Strength 5 out of 5 in both legs  Neurological: Normal balance/coordination. No tremor.    ASSESSMENT/PLAN:   Type II diabetes mellitus with nephropathy (HCC) - We'll get venous blood draw for A1c, may need to consider discontinuing metformin. - Plan: POCT HgB A1C, Hemoglobin A1c  Essential hypertension, benign - Improved from last visit, continue current medications  Cervical spondylosis with radiculopathy  Perceptive hearing loss, both sides - limit ability to complete history  At high risk for injury related to fall - Plan: Ambulatory referral to [facility] in home, initial encounter - Plan: Ambulatory referral to [facility]    Patient Instructions  Plan:  For diabetes: Are machine in the lab was not able to measure your sugars. We are sending you down to the lab for blood draw to confirm these levels.  Used to get a letter about these results.  For blood pressure: Your blood pressure is in a good range today. Continue current medications.  For falling: I have sent a referral for home health to have a physical therapist come out to your home for an evaluation and to help with balance training. I also recommend that you purchased a 4-footed cane to help with balance/stability.  For your feet: You are overdue to follow-up with podiatry downstairs, I recommend that you schedule a follow-up with them as soon as possible for foot care.    Visit summary with medication list and pertinent instructions was printed for patient to review. All questions at time of visit were answered - patient instructed to contact office with any additional concerns. ER/RTC precautions were reviewed with the patient. Follow-up plan: Return in about 6 months (around  10/12/2016) for annual exam .  Note: Total time spent 40 minutes, greater than 50% of the visit was spent face-to-face counseling and coordinating care for the following: The primary encounter diagnosis was Type II diabetes mellitus with nephropathy ([place]). Diagnoses of Essential hypertension, benign, Cervical spondylosis with radiculopathy, Perceptive hearing loss, both sides, At high risk for injury related to fall, and Fall in home, initial encounter were also pertinent to this visit.Marland Kitchen

## 2016-04-15 LAB — HEMOGLOBIN A1C
HEMOGLOBIN A1C: 7.2 % — AB (ref <5.7)
MEAN PLASMA GLUCOSE: 160 mg/dL

## 2016-04-24 ENCOUNTER — Ambulatory Visit: Payer: Self-pay | Admitting: Internal Medicine

## 2016-04-28 ENCOUNTER — Other Ambulatory Visit: Payer: Self-pay

## 2016-04-28 DIAGNOSIS — M4722 Other spondylosis with radiculopathy, cervical region: Secondary | ICD-10-CM

## 2016-04-28 MED ORDER — MELOXICAM 15 MG PO TABS: ORAL_TABLET | ORAL | 3 refills | Status: AC

## 2016-04-28 MED ORDER — FENOFIBRATE 48 MG PO TABS: 48.0000 mg | ORAL_TABLET | Freq: Every day | ORAL | 5 refills | Status: AC

## 2016-04-28 MED ORDER — HYDROCHLOROTHIAZIDE 25 MG PO TABS: ORAL_TABLET | ORAL | 2 refills | Status: AC

## 2016-04-28 NOTE — Telephone Encounter (Signed)
Patient request refill for Hydrochlorothiazide 25 mg, Fenofibrate 48mg , Meloxicam 15mg . It was sent to [facility]. Oneta Rack

## 2016-05-15 ENCOUNTER — Encounter: Payer: Self-pay | Admitting: Sports Medicine

## 2016-05-15 ENCOUNTER — Ambulatory Visit (INDEPENDENT_AMBULATORY_CARE_PROVIDER_SITE_OTHER): Payer: Medicare Other | Admitting: Sports Medicine

## 2016-05-15 DIAGNOSIS — M4722 Other spondylosis with radiculopathy, cervical region: Secondary | ICD-10-CM | POA: Diagnosis not present

## 2016-05-15 MED ORDER — PREDNISONE 50 MG PO TABS: ORAL_TABLET | ORAL | 0 refills | Status: AC

## 2016-05-15 NOTE — Assessment & Plan Note (Signed)
Back pain is referable again to the left sacroiliac joint. Physical therapy should work with this as well. 5 days of prednisone will also be very helpful, if insufficient relief we will proceed with left sacroiliac joint injection. I think that if insufficient relief after appropriate conservative measures we should get an MRI of his lumbar spine and pelvis. Return in one month.

## 2016-05-15 NOTE — Assessment & Plan Note (Signed)
Right-sided neck pain consistent with facet arthritis. 5 days of prednisone, I would like his current home health physical therapist to add neck rehabilitation. If insufficient relief we will proceed with a cervical facet injection.

## 2016-05-15 NOTE — Progress Notes (Signed)
  Subjective:    CC: Neck pain and hip pain  HPI: This is a pleasant 81 year old male, he returns to discuss his long-standing neck pain, I saw him 2 years ago, ultimately we obtained an MRI that showed severe cervical facet arthritis as well as degenerative disc disease. I never saw him back after the MRI. More recently he's having recurrence of pain, right-sided in the upper cervical spine.  He also has pain that he localizes on the left posterior hip, near the SI joint. We discussed this in the past and it's responded well to conservative measures.   Past medical history:  Negative.  See flowsheet/record as well for more information.  Surgical history: Negative.  See flowsheet/record as well for more information.  Family history: Negative.  See flowsheet/record as well for more information.  Social history: Negative.  See flowsheet/record as well for more information.  Allergies, and medications have been entered into the medical record, reviewed, and no changes needed.   Review of Systems: No fevers, chills, night sweats, weight loss, chest pain, or shortness of breath.   Objective:    General: Well Developed, well nourished, and in no acute distress.  Neuro: Alert and oriented x3, extra-ocular muscles intact, sensation grossly intact.  HEENT: Normocephalic, atraumatic, pupils equal round reactive to light, neck supple, no masses, no lymphadenopathy, thyroid nonpalpable.  Skin: Warm and dry, no rashes. Cardiac: Regular rate and rhythm, no murmurs rubs or gallops, no lower extremity edema.  Respiratory: Clear to auscultation bilaterally. Not using accessory muscles, speaking in full sentences. Neck: Negative spurling's Neck is held in severe kyphosis and flexion, limitations as expected and range of motion. Grip strength and sensation normal in bilateral hands Strength good C4 to T1 distribution No sensory change to C4 to T1 Reflexes normal Back Exam:  Inspection: Unremarkable    Motion: Flexion 45 deg, Extension 45 deg, Side Bending to 45 deg bilaterally,  Rotation to 45 deg bilaterally  SLR laying: Negative  XSLR laying: Negative  Palpable tenderness: Left sacroiliac joint. FABER: negative. Sensory change: Gross sensation intact to all lumbar and sacral dermatomes.  Reflexes: 2+ at both patellar tendons, 2+ at achilles tendons, Babinski's downgoing.  Strength at foot  Plantar-flexion: 5/5 Dorsi-flexion: 5/5 Eversion: 5/5 Inversion: 5/5  Leg strength  Quad: 5/5 Hamstring: 5/5 Hip flexor: 5/5 Hip abductors: 5/5  Gait unremarkable.  Impression and Recommendations:    Cervical spondylosis with radiculopathy Right-sided neck pain consistent with facet arthritis. 5 days of prednisone, I would like his current home health physical therapist to add neck rehabilitation. If insufficient relief we will proceed with a cervical facet injection.  Lumbar degenerative disc disease Back pain is referable again to the left sacroiliac joint. Physical therapy should work with this as well. 5 days of prednisone will also be very helpful, if insufficient relief we will proceed with left sacroiliac joint injection. I think that if insufficient relief after appropriate conservative measures we should get an MRI of his lumbar spine and pelvis. Return in one month.

## 2016-05-27 ENCOUNTER — Ambulatory Visit (INDEPENDENT_AMBULATORY_CARE_PROVIDER_SITE_OTHER): Payer: Medicare Other

## 2016-05-27 ENCOUNTER — Encounter: Payer: Self-pay | Admitting: Osteopathic Medicine

## 2016-05-27 ENCOUNTER — Ambulatory Visit (INDEPENDENT_AMBULATORY_CARE_PROVIDER_SITE_OTHER): Payer: Medicare Other | Admitting: Osteopathic Medicine

## 2016-05-27 VITALS — BP 136/61 | HR 61 | Ht 71.0 in | Wt 153.0 lb

## 2016-05-27 DIAGNOSIS — M7632 Iliotibial band syndrome, left leg: Secondary | ICD-10-CM | POA: Diagnosis not present

## 2016-05-27 DIAGNOSIS — M542 Cervicalgia: Secondary | ICD-10-CM

## 2016-05-27 MED ORDER — LIDOCAINE 5 % EX PTCH: 1.0000 | MEDICATED_PATCH | Freq: Two times a day (BID) | CUTANEOUS | 2 refills | Status: AC

## 2016-05-27 NOTE — Progress Notes (Signed)
[facility], sent letter

## 2016-05-27 NOTE — Progress Notes (Signed)
HPI: Alex Zavala is a 81 y.o. male  who presents to [facility] today, 05/27/16,  for chief complaint of:  Chief Complaint  Patient presents with  . Ear Pain    RIGHT    Pain . Location: points to ear but then explains it is behind ear and into and across the neck at base of skull  . Quality: sore . Duration: almost a year . Timing: "like a seesaw" on and off   . Modifying factors: nothing makes better or worse  . Assoc signs/symptoms: no weakness in arms  Leg stiffness on L leg: uses cane to ambulate     Past medical history, surgical history, social history and family history reviewed.    Current medication list and allergy/intolerance information reviewed.   Current Outpatient Prescriptions on File Prior to Visit  Medication Sig Dispense Refill  . acetaminophen-codeine (TYLENOL #3) 300-30 MG tablet Take 1 tablet by mouth every 4 (four) hours as needed for moderate pain or severe pain (use sparingly to avoid tolerance/dependence). #60 for 30 days 60 tablet 0  . AMBULATORY NON FORMULARY MEDICATION Glucometer per insurance formulary.  Glucometer test strips, use to check blood sugar daily.  Dx: Type 2 Diabetes 50 Units 11  . aspirin 81 MG tablet Take 81 mg by mouth daily.    . clotrimazole-betamethasone (LOTRISONE) cream APPLY TO AFFECTED AREA TWICE A DAY FOR TWO WEEKS, MAY REQUIRE FOUR WEEKS IF INVOLVING THE FEET/TOES. 90 g 6  . fenofibrate (TRICOR) 48 MG tablet Take 1 tablet (48 mg total) by mouth daily. 30 tablet 5  . hydrochlorothiazide (HYDRODIURIL) 25 MG tablet One tablet by mouth every morning for blood pressure control. 30 tablet 2  . lovastatin (MEVACOR) 40 MG tablet Take 2 tablets (80 mg total) by mouth daily. 60 tablet 5  . meloxicam (MOBIC) 15 MG tablet One by mouth daily with a meal as needed for pain. 30 tablet 3  . metFORMIN (GLUCOPHAGE) 1000 MG tablet One by mouth twice a day for blood sugar control. 180 tablet 1  . metoprolol  (LOPRESSOR) 100 MG tablet TAKE 2 TABLETs (200 MG TOTAL) BY MOUTH TWICE A DAY 180 tablet 1  . nitroGLYCERIN (NITROSTAT) 0.4 MG SL tablet Place 1 tablet (0.4 mg total) under the tongue every 5 (five) minutes as needed for chest pain. 50 tablet 3  . pantoprazole (PROTONIX) 40 MG tablet TAKE 1 TABLET BY MOUTH DAILY. 30 tablet 1  . predniSONE (DELTASONE) 50 MG tablet One tab PO daily for 5 days. 5 tablet 0  . ranolazine (RANEXA) 500 MG 12 hr tablet Take 1 tablet (500 mg total) by mouth 2 (two) times daily. 180 tablet 1  . valsartan (DIOVAN) 160 MG tablet Take 1 tablet (160 mg total) by mouth daily. 30 tablet 11   No current facility-administered medications on file prior to visit.    Allergies  Allergen Reactions  . Invokana [Canagliflozin]   . Januvia [Sitagliptin] Other (See Comments)    Abdominal pain      Review of Systems:  Constitutional: No recent illness  Cardiac: No  chest pain,  Respiratory:  No  shortness of breath.   Skin: No  Rash  Neurologic: No  Dizziness   Exam:  BP 136/61   Pulse 61   Ht 5\' 11"  (1.803 m)   Wt 153 lb (69.4 kg)   BMI 21.34 kg/m   Constitutional: VS see above. General Appearance: alert, well-developed, well-nourished, NAD  Ears, Nose,  Mouth, Throat: MMM, Normal external inspection ears/nares/mouth/lips/gums. Sclerotic TM on R  Neck: No masses, trachea midline.   Respiratory: Normal respiratory effort.   Musculoskeletal: Gait normal. Symmetric and independent movement of all extremities. Pronounced thoracic kyphosis, (+)tenderness and muscle spasm cervical/occipital region, no overlying rash, no point tenderness   Neurological: Normal balance/coordination. No tremor.  Skin: warm, dry, intact.    XR personally reviewed  Dg Cervical Spine 2 Or 3 Views  Result Date: 05/27/2016 CLINICAL DATA:  Neck pain. EXAM: CERVICAL SPINE - 2-3 VIEW COMPARISON:  02/03/2016 . FINDINGS: Severe diffuse cervical spine degenerative change with loss of normal  cervical lordosis. No acute bony abnormality identified. No evidence of fracture or dislocation. IMPRESSION: Severe diffuse cervical spine degenerative change with loss of normal cervical lordosis. Electronically Signed   By: Marcello Moores  Register   On: 05/27/2016 16:20     ASSESSMENT/PLAN:   Neck pain - Plan: DG Cervical Spine 2 or 3 views  Iliotibial band syndrome of left side    Patient Instructions  For leg: Ask your physical therapist to help with stretching and strengthening of this  For neck pain: Xray today Patches to neck, continue Tylenol #3 as needed    Follow-up plan: Return if symptoms worsen or fail to improve.  Visit summary with medication list and pertinent instructions was printed for patient to review, alert [place] if any changes needed. All questions at time of visit were answered - patient instructed to contact office with any additional concerns. ER/RTC precautions were reviewed with the patient and understanding verbalized.

## 2016-05-27 NOTE — Patient Instructions (Addendum)
For leg: Ask your physical therapist to help with stretching and strengthening of this  For neck pain: Xray today Patches to neck, continue Tylenol #3 as needed

## 2016-06-12 ENCOUNTER — Ambulatory Visit: Payer: Self-pay | Admitting: Sports Medicine

## 2016-06-16 ENCOUNTER — Ambulatory Visit: Payer: Self-pay | Admitting: Sports Medicine

## 2016-06-17 ENCOUNTER — Ambulatory Visit: Payer: Self-pay | Admitting: Sports Medicine

## 2016-06-18 ENCOUNTER — Ambulatory Visit (INDEPENDENT_AMBULATORY_CARE_PROVIDER_SITE_OTHER): Payer: Medicare Other

## 2016-06-18 ENCOUNTER — Ambulatory Visit (INDEPENDENT_AMBULATORY_CARE_PROVIDER_SITE_OTHER): Payer: Medicare Other | Admitting: Sports Medicine

## 2016-06-18 ENCOUNTER — Encounter: Payer: Self-pay | Admitting: Sports Medicine

## 2016-06-18 DIAGNOSIS — S8991XA Unspecified injury of right lower leg, initial encounter: Secondary | ICD-10-CM

## 2016-06-18 DIAGNOSIS — M545 Low back pain: Secondary | ICD-10-CM | POA: Diagnosis not present

## 2016-06-18 DIAGNOSIS — M5117 Intervertebral disc disorders with radiculopathy, lumbosacral region: Secondary | ICD-10-CM

## 2016-06-18 DIAGNOSIS — M1711 Unilateral primary osteoarthritis, right knee: Secondary | ICD-10-CM | POA: Diagnosis not present

## 2016-06-18 DIAGNOSIS — M419 Scoliosis, unspecified: Secondary | ICD-10-CM

## 2016-06-18 DIAGNOSIS — S8992XA Unspecified injury of left lower leg, initial encounter: Secondary | ICD-10-CM | POA: Diagnosis not present

## 2016-06-18 DIAGNOSIS — M4316 Spondylolisthesis, lumbar region: Secondary | ICD-10-CM | POA: Diagnosis not present

## 2016-06-18 DIAGNOSIS — M4722 Other spondylosis with radiculopathy, cervical region: Secondary | ICD-10-CM | POA: Diagnosis not present

## 2016-06-18 DIAGNOSIS — M1612 Unilateral primary osteoarthritis, left hip: Secondary | ICD-10-CM | POA: Diagnosis not present

## 2016-06-18 DIAGNOSIS — W19XXXA Unspecified fall, initial encounter: Secondary | ICD-10-CM

## 2016-06-18 NOTE — Addendum Note (Signed)
Addended by: Silverio Decamp on: 06/18/2016 11:53 AM   Modules accepted: Orders

## 2016-06-18 NOTE — Progress Notes (Signed)
   Subjective:    I'm seeing this patient as a consultation for:  Dr. Emeterio Reeve  CC: Right knee pain  HPI: This is a pleasant 81 year old male, he is very hard of hearing. Unfortunately he recently fell injuring his right knee, he really had no swelling or bruising but does have some pain at the medial joint line. Moderate, persistent without radiation, no mechanical symptoms.  Neck pain: MRI from 2016 did show fairly severe facet arthritis, he is agreeable at this point proceed with cervical facet joint injections, prednisone was not helpful.  Back and hip pain, left: We are going to discuss this at a future visit considering we have already addressed 2 issues.  Generalized pruritus: Encouraged to discuss this with his PCP.  Past medical history:  Negative.  See flowsheet/record as well for more information.  Surgical history: Negative.  See flowsheet/record as well for more information.  Family history: Negative.  See flowsheet/record as well for more information.  Social history: Negative.  See flowsheet/record as well for more information.  Allergies, and medications have been entered into the medical record, reviewed, and no changes needed.   Review of Systems: No headache, visual changes, nausea, vomiting, diarrhea, constipation, dizziness, abdominal pain, skin rash, fevers, chills, night sweats, weight loss, swollen lymph nodes, body aches, joint swelling, muscle aches, chest pain, shortness of breath, mood changes, visual or auditory hallucinations.   Objective:   General: Well Developed, well nourished, and in no acute distress.  Neuro/Psych: Alert and oriented x3, extra-ocular muscles intact, able to move all 4 extremities, sensation grossly intact. Skin: Warm and dry, no rashes noted.  Respiratory: Not using accessory muscles, speaking in full sentences, trachea midline.  Cardiovascular: Pulses palpable, no extremity edema. Abdomen: Does not appear distended. Right  Knee: Minimally swollen with tenderness at the medial joint line ROM normal in flexion and extension and lower leg rotation. Ligaments with solid consistent endpoints including ACL, PCL, LCL, MCL. Negative Mcmurray's and provocative meniscal tests. Non painful patellar compression. Patellar and quadriceps tendons unremarkable. Hamstring and quadriceps strength is normal.  Procedure: Real-time Ultrasound Guided Injection of right knee Device: GE Logiq E  Verbal informed consent obtained.  Time-out conducted.  Noted no overlying erythema, induration, or other signs of local infection.  Skin prepped in a sterile fashion.  Local anesthesia: Topical Ethyl chloride.  With sterile technique and under real time ultrasound guidance:  1 mL Kenalog 40, 2 mL lidocaine, 2 mL bupivacaine injected easily. Completed without difficulty  Pain immediately resolved suggesting accurate placement of the medication.  Advised to call if fevers/chills, erythema, induration, drainage, or persistent bleeding.  Images permanently stored and available for review in the ultrasound unit.  Impression: Technically successful ultrasound guided injection.  Impression and Recommendations:   This case required medical decision making of moderate complexity.  Cervical spondylosis with radiculopathy Persistent right-sided neck pain consistent with facet arthritis, we did confirm this on MRI. I'm going to proceed at this point to multilevel right-sided cervical facet injections. He does have an MRI from 2016 that was personally reviewed and shows fairly severe facet arthritis on the right at C2-3 and C3-4, these will be our initial interventional targets.  Primary osteoarthritis of right knee Injection as above, baseline x-rays.  Return in one month.

## 2016-06-18 NOTE — Assessment & Plan Note (Signed)
Persistent right-sided neck pain consistent with facet arthritis, we did confirm this on MRI. I'm going to proceed at this point to multilevel right-sided cervical facet injections. He does have an MRI from 2016 that was personally reviewed and shows fairly severe facet arthritis on the right at C2-3 and C3-4, these will be our initial interventional targets.

## 2016-06-18 NOTE — Assessment & Plan Note (Signed)
Injection as above, baseline x-rays.  Return in one month.

## 2016-06-25 ENCOUNTER — Telehealth: Payer: Self-pay

## 2016-06-25 MED ORDER — AMBULATORY NON FORMULARY MEDICATION: 0 refills | Status: AC

## 2016-06-25 MED ORDER — CANE MISC: 0 refills | Status: AC

## 2016-06-25 NOTE — Addendum Note (Signed)
Addended by: Silverio Decamp on: 06/25/2016 04:38 PM   Modules accepted: Orders

## 2016-06-25 NOTE — Telephone Encounter (Signed)
Rx in box. 

## 2016-06-25 NOTE — Telephone Encounter (Signed)
Mr. Ordway would like a letter stating he needs an orthopedic bed so Medicare will pay for it.  He would also like a script for a new cane.

## 2016-07-06 ENCOUNTER — Ambulatory Visit (INDEPENDENT_AMBULATORY_CARE_PROVIDER_SITE_OTHER): Payer: Medicare Other | Admitting: Osteopathic Medicine

## 2016-07-06 ENCOUNTER — Encounter: Payer: Self-pay | Admitting: Osteopathic Medicine

## 2016-07-06 VITALS — BP 159/76 | HR 83 | Temp 97.7°F | Resp 16 | Wt 158.0 lb

## 2016-07-06 DIAGNOSIS — L299 Pruritus, unspecified: Secondary | ICD-10-CM

## 2016-07-06 DIAGNOSIS — H9193 Unspecified hearing loss, bilateral: Secondary | ICD-10-CM | POA: Diagnosis not present

## 2016-07-06 MED ORDER — TRIAMCINOLONE ACETONIDE 0.5 % EX CREA: 1.0000 "application " | TOPICAL_CREAM | Freq: Two times a day (BID) | CUTANEOUS | 0 refills | Status: AC

## 2016-07-06 NOTE — Patient Instructions (Addendum)
Plan: 1. Prescription cream as needed for itching - I do not see a rash today 2. If no better after one week, let us know and we can try another medicine or send a referral to a dermatologist 3. You can also try over-the-counter Zyrtec or Claritin

## 2016-07-06 NOTE — Progress Notes (Signed)
Patient states he Is here for itching skin of legs. Also has a cough.

## 2016-07-07 NOTE — Progress Notes (Signed)
HPI: Alex Zavala is a 81 y.o. male  who presents to [place] today, 07/07/16,  for chief complaint of:  Chief Complaint  Patient presents with  . Rash    itching skin/ no rash    Itching . Context: No known insect bite/exposure . Location: Back of left knee, left shoulder, lower back . Quality: Itching without rash . Modifying factors: No over-the-counter medications tried . Assoc signs/symptoms: No fever or chills    Past medical history, surgical history, social history and family history reviewed.  Patient Active Problem List   Diagnosis Date Noted  . Itching 07/06/2016  . Primary osteoarthritis of right knee 06/18/2016  . Upper airway cough syndrome 03/12/2016  . Arterial atherosclerosis 02/03/2016  . History of prostate cancer 01/17/2016  . Difficulty urinating 01/17/2016  . Laceration of head 11/05/2015  . Type 2 diabetes with nephropathy ([place]) 07/15/2015  . Scalp pain 01/22/2015  . Left shoulder pain 10/08/2014  . Cervical spondylosis with radiculopathy 09/10/2014  . Lumbar degenerative disc disease 07/18/2014  . Osteoarthritis of left hip 10/04/2013  . Microscopic hematuria 08/22/2013  . Hemoptysis 12/12/2012  . GERD (gastroesophageal reflux disease) 12/05/2012  . Microalbuminuria 12/05/2012  . History of colon cancer, stage II 12/05/2012  . Family history of prostate cancer 12/05/2012  . Hyperlipidemia LDL goal < 70 11/30/2012  . Essential hypertension, benign 11/30/2012  . Skin lesion of back 11/30/2012  . Cough 11/30/2012  . Coronary atherosclerosis 04/12/2012  . Perceptive hearing loss, both sides 04/12/2012    Current medication list and allergy/intolerance information reviewed.   Current Outpatient Prescriptions on File Prior to Visit  Medication Sig Dispense Refill  . acetaminophen-codeine (TYLENOL #3) 300-30 MG tablet Take 1 tablet by mouth every 4 (four) hours as needed for moderate pain or severe pain (use  sparingly to avoid tolerance/dependence). #60 for 30 days 60 tablet 0  . AMBULATORY NON FORMULARY MEDICATION Glucometer per insurance formulary.  Glucometer test strips, use to check blood sugar daily.  Dx: Type 2 Diabetes 50 Units 11  . AMBULATORY NON FORMULARY MEDICATION Orthopedic bed for use nightly, medically necessary 1 each 0  . aspirin 81 MG tablet Take 81 mg by mouth daily.    . clotrimazole-betamethasone (LOTRISONE) cream APPLY TO AFFECTED AREA TWICE A DAY FOR TWO WEEKS, MAY REQUIRE FOUR WEEKS IF INVOLVING THE FEET/TOES. 90 g 6  . fenofibrate (TRICOR) 48 MG tablet Take 1 tablet (48 mg total) by mouth daily. 30 tablet 5  . hydrochlorothiazide (HYDRODIURIL) 25 MG tablet One tablet by mouth every morning for blood pressure control. 30 tablet 2  . lidocaine (LIDODERM) 5 % Place 1 patch onto the skin every 12 (twelve) hours. To neck. Remove & Discard patch within 12 hours or as directed by MD 30 patch 2  . lovastatin (MEVACOR) 40 MG tablet Take 2 tablets (80 mg total) by mouth daily. 60 tablet 5  . meloxicam (MOBIC) 15 MG tablet One by mouth daily with a meal as needed for pain. 30 tablet 3  . metFORMIN (GLUCOPHAGE) 1000 MG tablet One by mouth twice a day for blood sugar control. 180 tablet 1  . metoprolol (LOPRESSOR) 100 MG tablet TAKE 2 TABLETs (200 MG TOTAL) BY MOUTH TWICE A DAY 180 tablet 1  . Misc. Devices (CANE) MISC 4-point cane, medically necessary 1 each 0  . nitroGLYCERIN (NITROSTAT) 0.4 MG SL tablet Place 1 tablet (0.4 mg total) under the tongue every 5 (five) minutes as needed  for chest pain. 50 tablet 3  . pantoprazole (PROTONIX) 40 MG tablet TAKE 1 TABLET BY MOUTH DAILY. 30 tablet 1  . predniSONE (DELTASONE) 50 MG tablet One tab PO daily for 5 days. 5 tablet 0  . ranolazine (RANEXA) 500 MG 12 hr tablet Take 1 tablet (500 mg total) by mouth 2 (two) times daily. 180 tablet 1  . valsartan (DIOVAN) 160 MG tablet Take 1 tablet (160 mg total) by mouth daily. 30 tablet 11   No current  facility-administered medications on file prior to visit.    Allergies  Allergen Reactions  . Invokana [Canagliflozin]   . Januvia [Sitagliptin] Other (See Comments)    Abdominal pain      Review of Systems:  Constitutional: No recent illness  Skin: No  Rash  Exam:  BP (!) 159/76 (BP Location: Left Arm, Patient Position: Sitting, Cuff Size: Normal)   Pulse 83   Temp 97.7 F (36.5 C) (Oral)   Resp 16   Wt 158 lb (71.7 kg)   BMI 22.04 kg/m   Constitutional: VS see above. General Appearance: alert, well-developed, well-nourished, NAD  Neurological: Normal balance/coordination. No tremor.  Skin: warm, dry. No rash, there is some excoriation on the left half for patient has scratched at it. Otherwise skin appears normal minus some dry seborrheic keratoses on the back, may be causing itching?     ASSESSMENT/PLAN:   Itching - No rash noted, see patient printed instructions, limited course of steroids as needed, advised mild lotion - Plan: triamcinolone cream (KENALOG) 0.5 %  Bilateral hearing loss, unspecified hearing loss type - limits history - pt speech is quite difficult to understand    Patient Instructions  Plan: 1. Prescription cream as needed for itching - I do not see a rash today 2. If no better after one week, let us know and we can try another medicine or send a referral to a dermatologist 3. You can also try over-the-counter Zyrtec or Claritin     Follow-up plan: Return if symptoms worsen or fail to improve.  Visit summary with medication list and pertinent instructions was printed for patient to review, alert [place] if any changes needed. All questions at time of visit were answered - patient instructed to contact office with any additional concerns. ER/RTC precautions were reviewed with the patient and understanding verbalized.

## 2016-07-09 ENCOUNTER — Telehealth: Payer: Self-pay | Admitting: *Deleted

## 2016-07-09 ENCOUNTER — Encounter: Payer: Self-pay | Admitting: Osteopathic Medicine

## 2016-07-09 DIAGNOSIS — S72009A Fracture of unspecified part of neck of unspecified femur, initial encounter for closed fracture: Secondary | ICD-10-CM | POA: Insufficient documentation

## 2016-07-09 NOTE — Telephone Encounter (Signed)
Hospital has already sent me records, noted

## 2016-07-09 NOTE — Telephone Encounter (Signed)
Patient's sister called and to let Dr. Sheppard Coil know that he is in the hospital because he fell and broke his hip.

## 2016-07-16 ENCOUNTER — Inpatient Hospital Stay: Payer: Self-pay | Admitting: Osteopathic Medicine

## 2016-07-16 ENCOUNTER — Ambulatory Visit: Payer: Self-pay | Admitting: Sports Medicine

## 2016-07-17 ENCOUNTER — Ambulatory Visit: Payer: Self-pay | Admitting: Sports Medicine

## 2016-07-28 ENCOUNTER — Ambulatory Visit: Payer: Self-pay | Admitting: Sports Medicine

## 2016-10-12 ENCOUNTER — Ambulatory Visit: Payer: Self-pay

## 2017-07-07 IMAGING — CR DG RIBS W/ CHEST 3+V*R*
3 series · 3 of 3 positions shown · non-contrast
Comparison: 08/03/2014

CLINICAL DATA: Right rib pain after fall 3 days ago

EXAM:
RIGHT RIBS AND CHEST - 3+ VIEW

[chest pa]
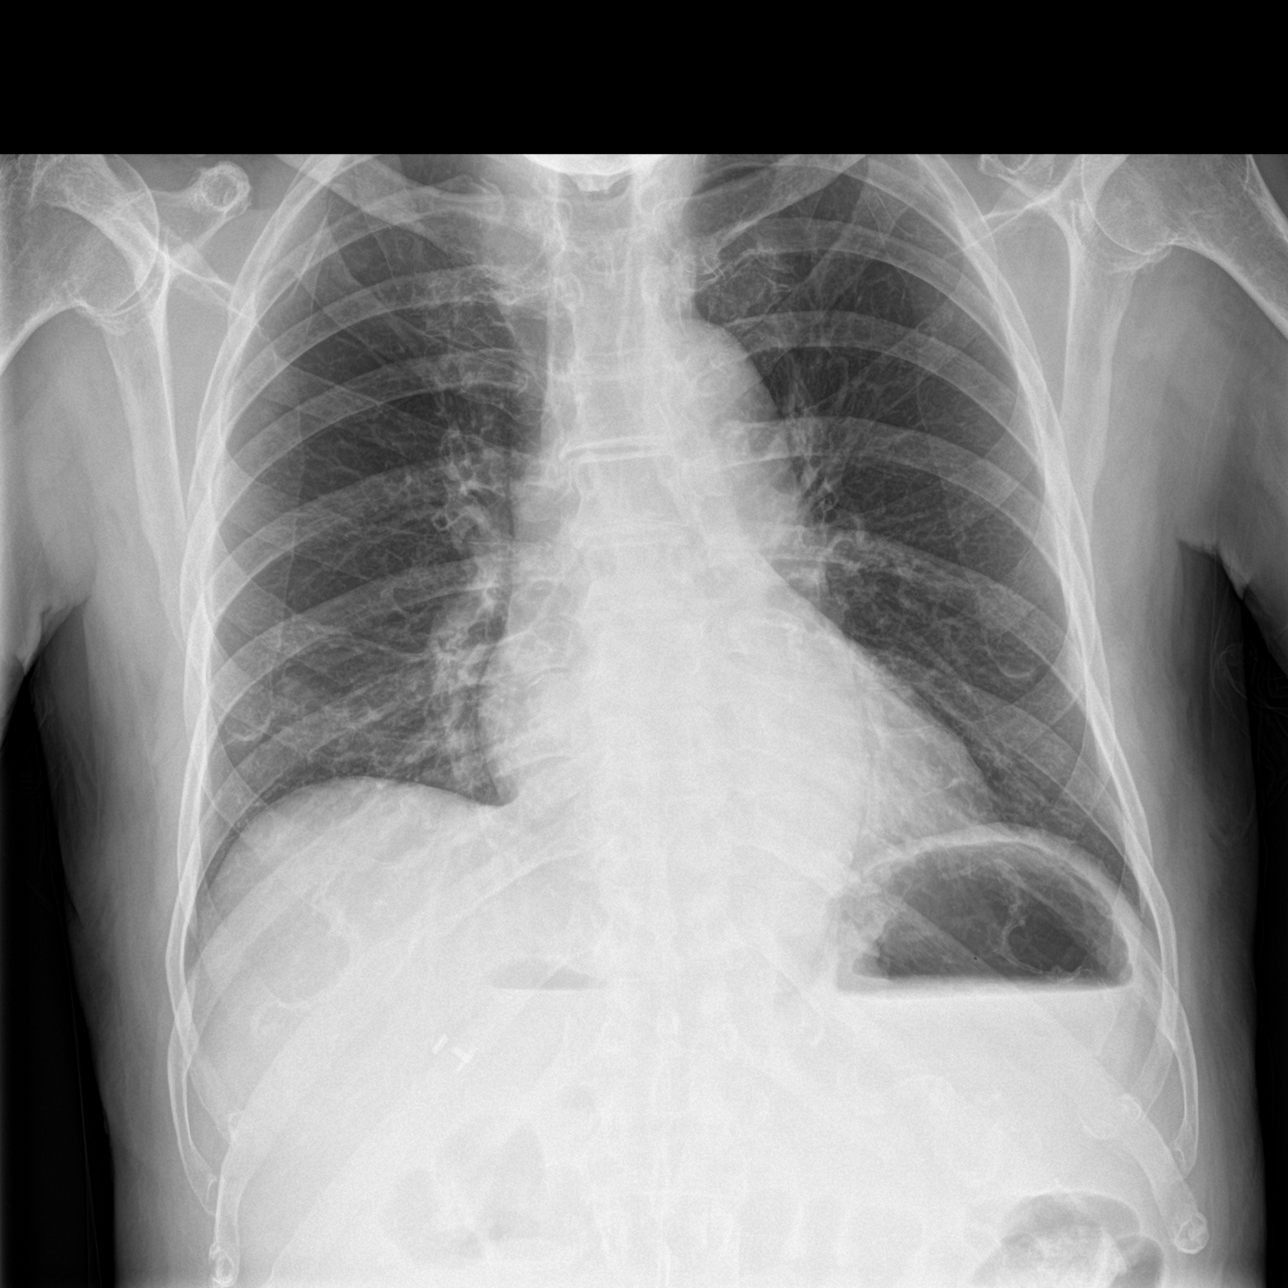

[rib ap]
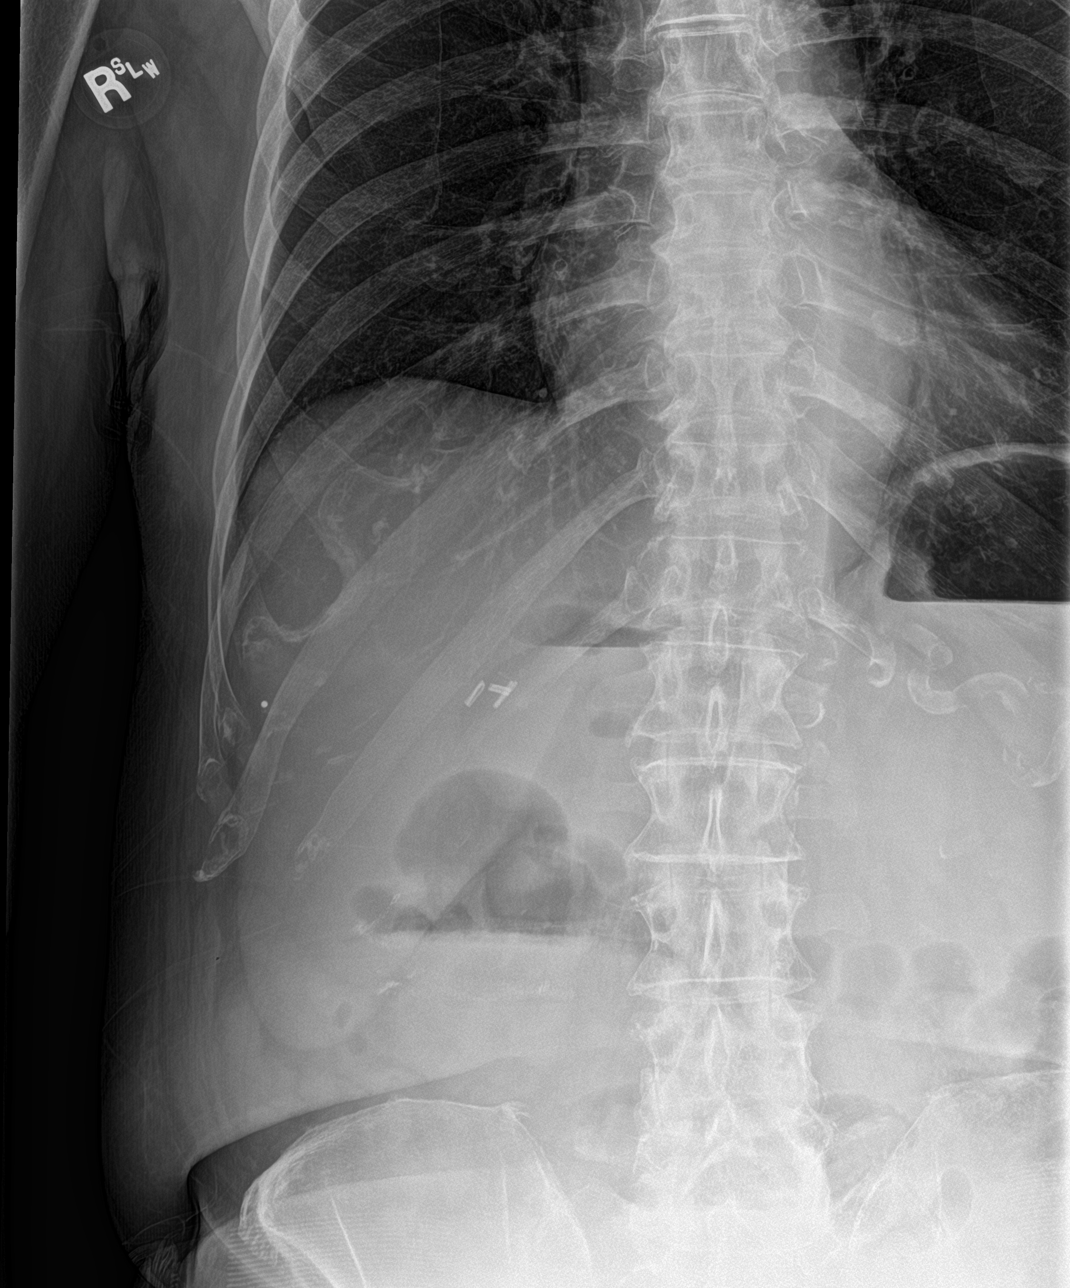

[rib ap obl]
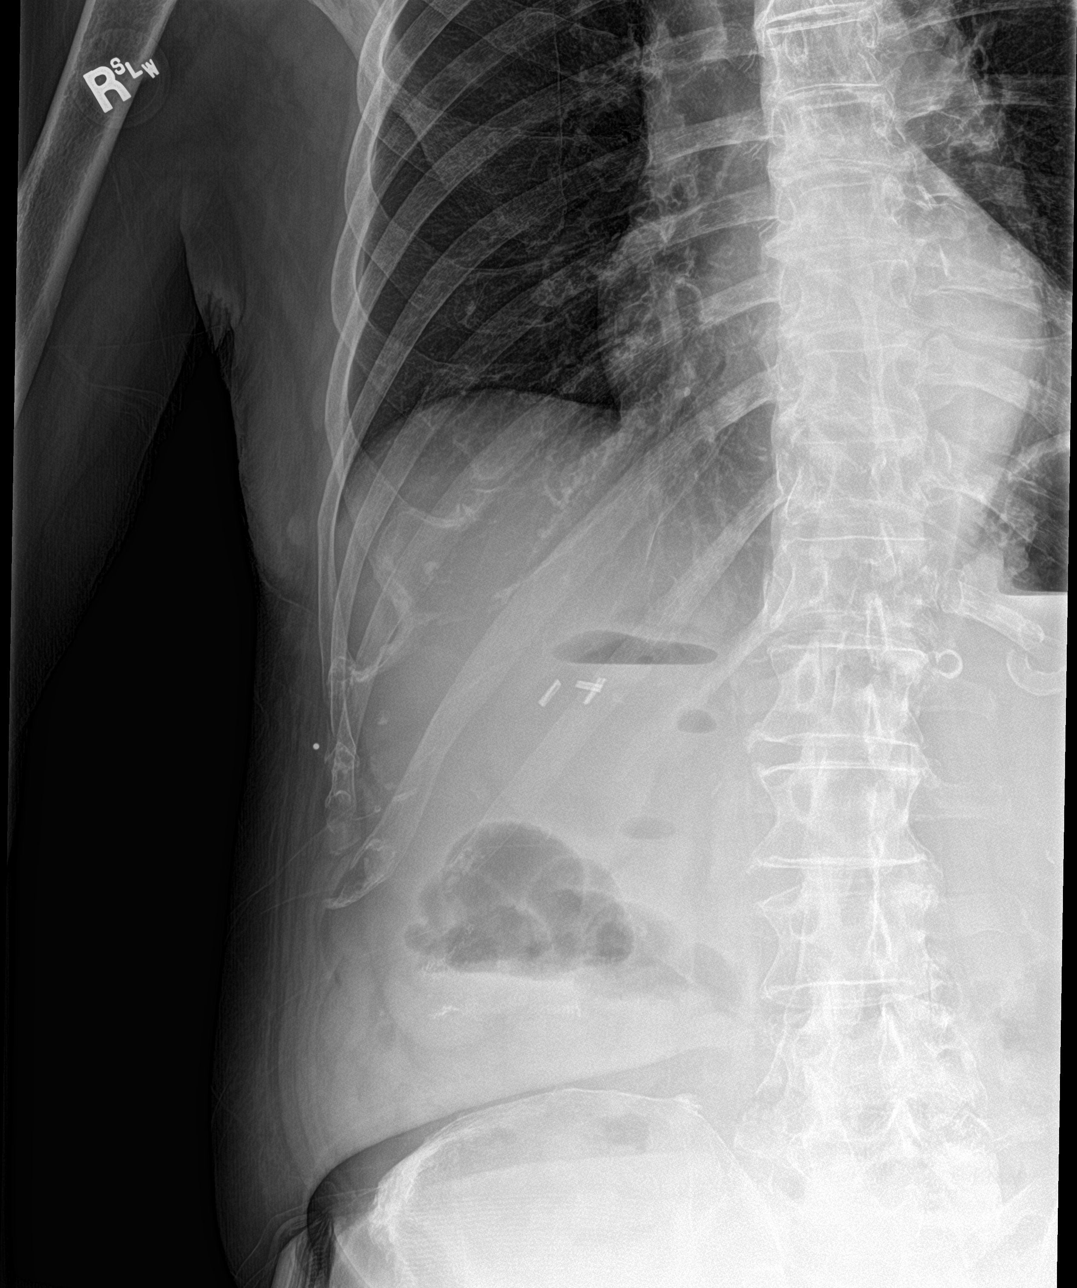

[3 of 3 positions shown; findings below may reference images not displayed]

FINDINGS: Three views right ribs submitted. No acute infiltrate or pulmonary
edema. There is nondisplaced fracture of the right ninth rib.
IMPRESSION: Nondisplaced fracture of the right ninth rib.  No pneumothorax.

## 2017-09-27 DEATH — deceased

## 2018-03-16 IMAGING — DX DG CHEST 2V
2 series · 2 of 2 positions shown · non-contrast
Comparison: Chest x-ray of January 28, 2015

CLINICAL DATA: Cough and chest congestion

EXAM:
CHEST  2 VIEW

[chest pa]
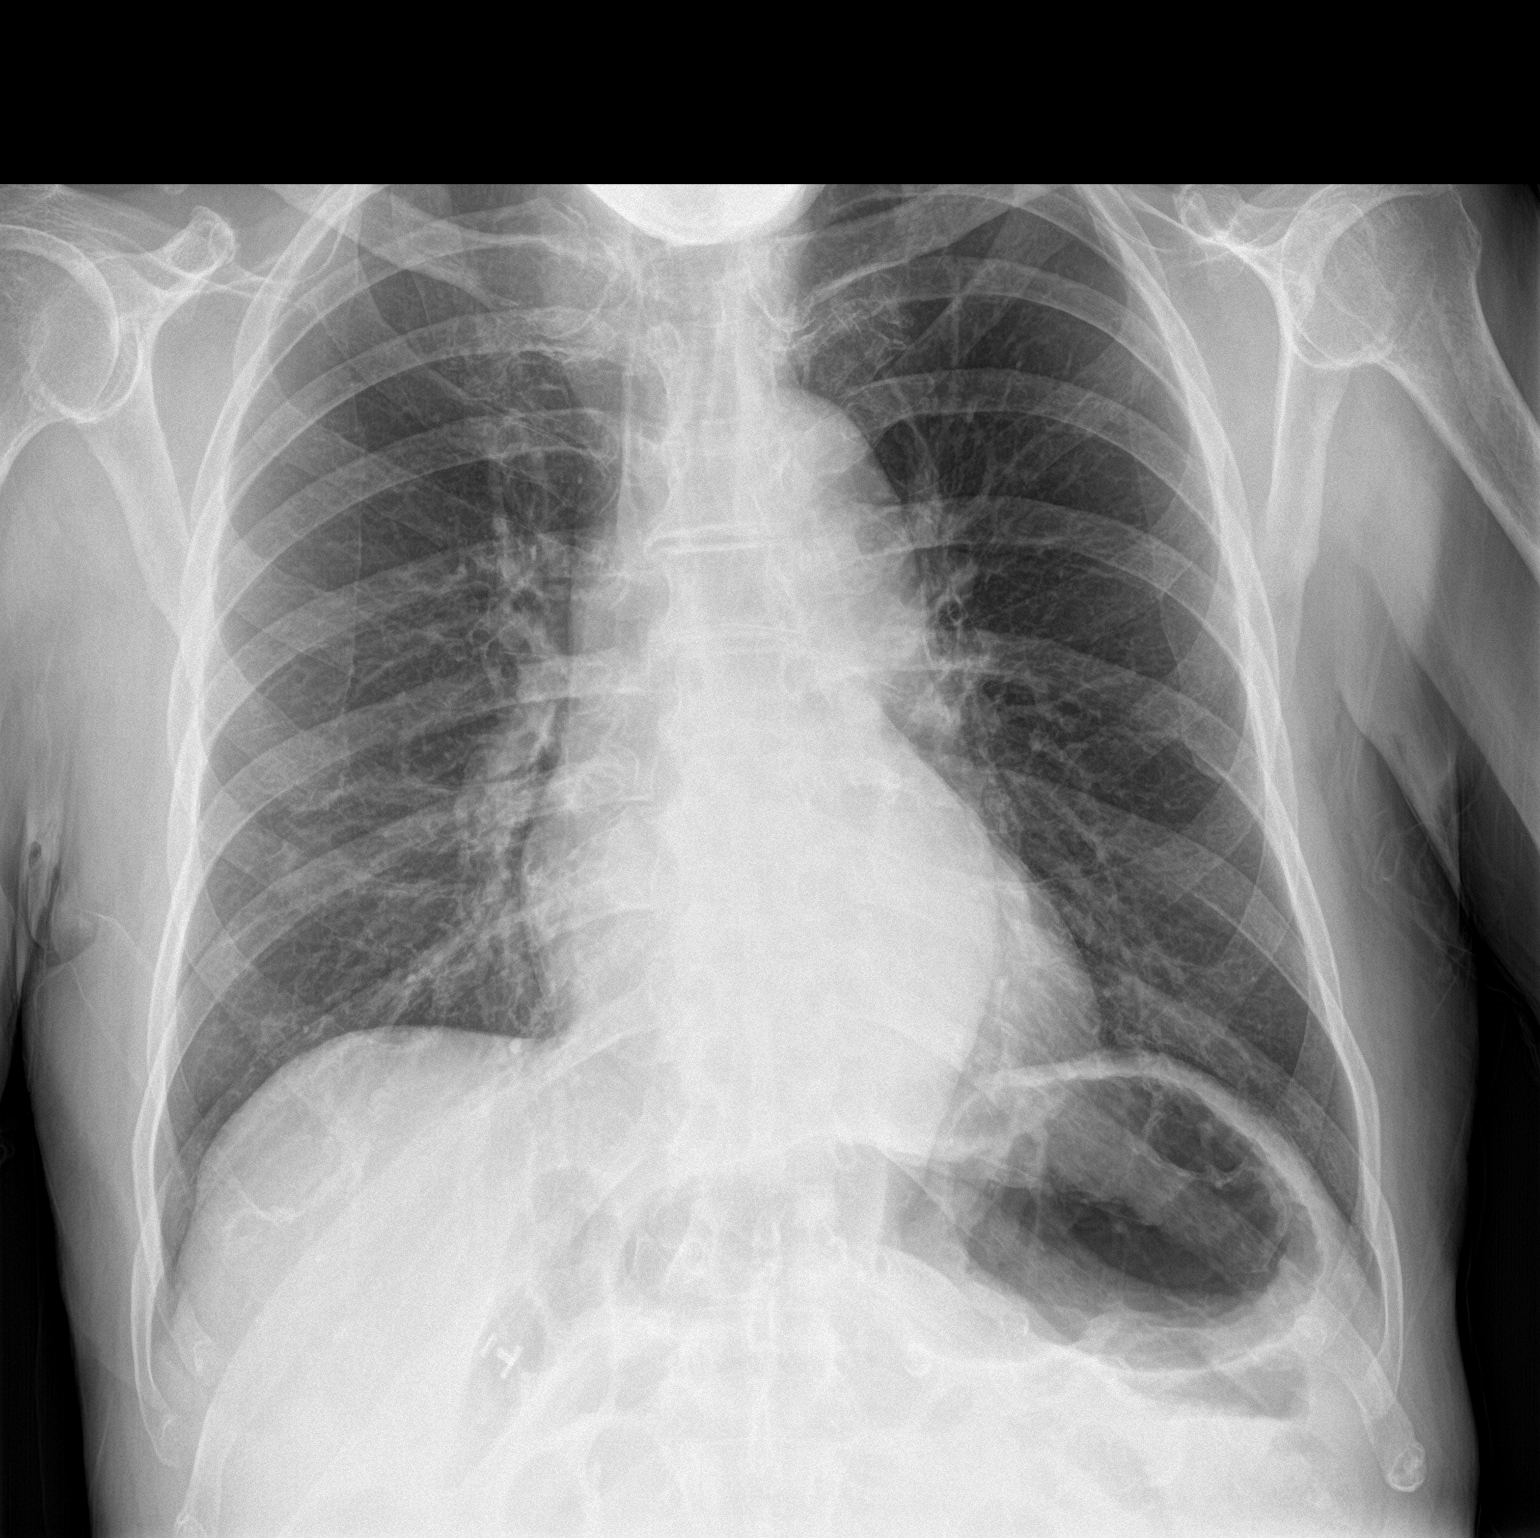

[chest lat]
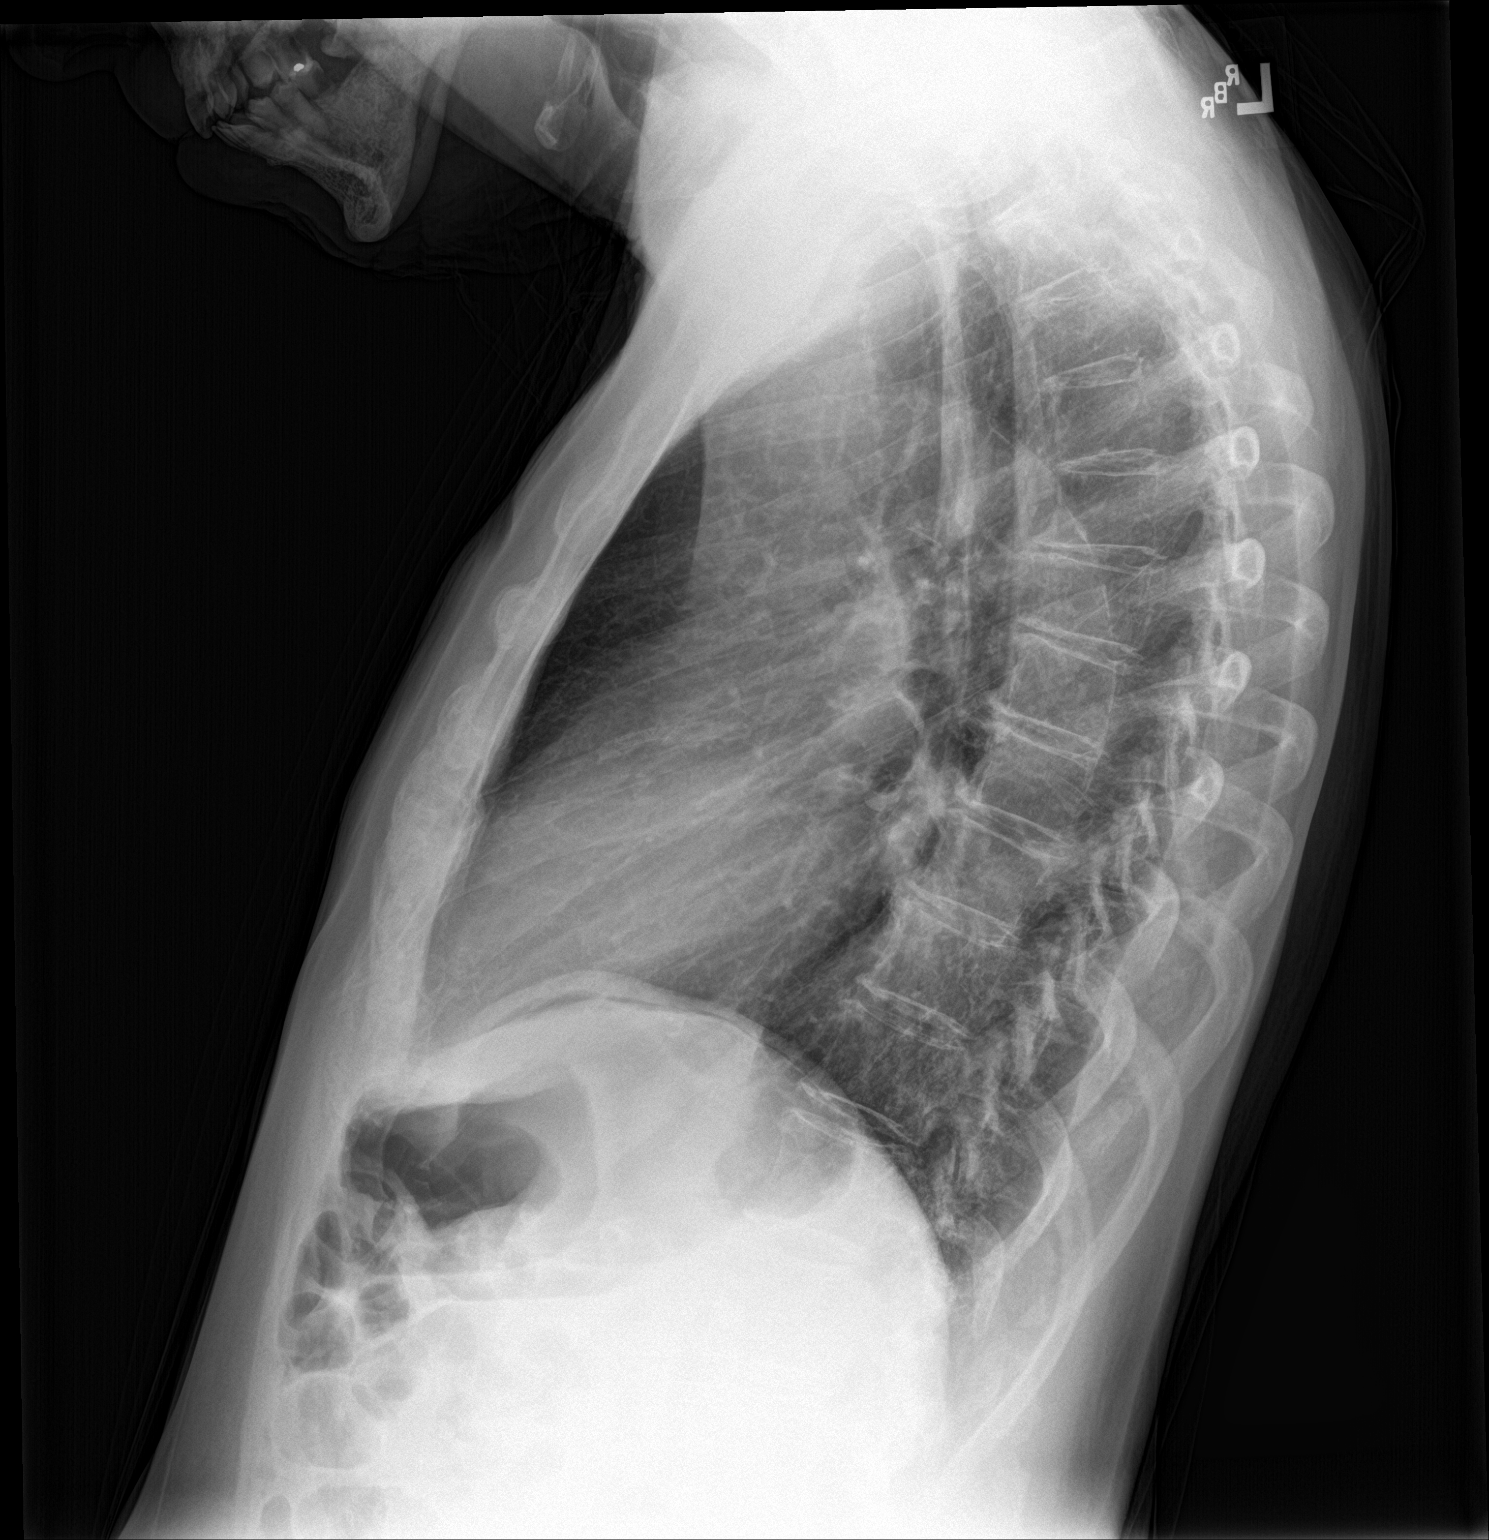

[2 of 2 positions shown; findings below may reference images not displayed]

FINDINGS: The lungs are well-expanded. There is no focal infiltrate. There is
no pleural effusion or pneumothorax. The heart is normal in size.
The pulmonary vascularity is not engorged. The mediastinum is normal
in width. The bony thorax exhibits no acute abnormality. There is
calcification in the wall of the aortic arch and proximal descending
thoracic aorta. Chronic widening of the right AC joint.
IMPRESSION: 1. No acute cardiopulmonary abnormality. Specifically there is no
evidence of pneumonia.
2. Aortic atherosclerosis.
